# Patient Record
Sex: Female | Born: 1935 | Race: Black or African American | Hispanic: No | Marital: Married | Smoking: Former smoker
Health system: Southern US, Community
[De-identification: ages and names within clinical notes are randomized; demographics above are authoritative.]

## PROBLEM LIST (undated history)

## (undated) DIAGNOSIS — Z85038 Personal history of other malignant neoplasm of large intestine: Secondary | ICD-10-CM

## (undated) DIAGNOSIS — R011 Cardiac murmur, unspecified: Secondary | ICD-10-CM

## (undated) DIAGNOSIS — I83893 Varicose veins of bilateral lower extremities with other complications: Secondary | ICD-10-CM

## (undated) DIAGNOSIS — S61209D Unspecified open wound of unspecified finger without damage to nail, subsequent encounter: Secondary | ICD-10-CM

## (undated) DIAGNOSIS — M1990 Unspecified osteoarthritis, unspecified site: Secondary | ICD-10-CM

## (undated) DIAGNOSIS — R739 Hyperglycemia, unspecified: Secondary | ICD-10-CM

## (undated) DIAGNOSIS — I10 Essential (primary) hypertension: Secondary | ICD-10-CM

## (undated) DIAGNOSIS — E785 Hyperlipidemia, unspecified: Secondary | ICD-10-CM

## (undated) HISTORY — DX: Cardiac murmur, unspecified: R01.1

## (undated) HISTORY — DX: Unspecified osteoarthritis, unspecified site: M19.90

## (undated) HISTORY — PX: ABDOMINAL HYSTERECTOMY: SHX81

## (undated) HISTORY — DX: Hyperglycemia, unspecified: R73.9

## (undated) HISTORY — DX: Personal history of other malignant neoplasm of large intestine: Z85.038

## (undated) HISTORY — DX: Varicose veins of bilateral lower extremities with other complications: I83.893

## (undated) HISTORY — DX: Essential (primary) hypertension: I10

## (undated) HISTORY — DX: Hyperlipidemia, unspecified: E78.5

## (undated) HISTORY — DX: Unspecified open wound of unspecified finger without damage to nail, subsequent encounter: S61.209D

---

## 1997-04-25 HISTORY — PX: COLON SURGERY: SHX602

## 1999-04-26 HISTORY — PX: INGUINAL HERNIA REPAIR: SUR1180

## 2004-08-11 ENCOUNTER — Ambulatory Visit: Payer: Self-pay

## 2004-08-25 ENCOUNTER — Ambulatory Visit: Payer: Self-pay

## 2005-09-20 ENCOUNTER — Emergency Department: Payer: Self-pay | Admitting: Unknown Physician Specialty

## 2005-09-20 ENCOUNTER — Other Ambulatory Visit: Payer: Self-pay

## 2006-04-25 HISTORY — PX: VEIN SURGERY: SHX48

## 2007-01-25 ENCOUNTER — Ambulatory Visit: Payer: Self-pay

## 2008-02-19 ENCOUNTER — Ambulatory Visit: Payer: Self-pay | Admitting: Ophthalmology

## 2008-03-05 ENCOUNTER — Ambulatory Visit: Payer: Self-pay | Admitting: Ophthalmology

## 2008-04-25 HISTORY — PX: EYE SURGERY: SHX253

## 2008-06-04 ENCOUNTER — Ambulatory Visit: Payer: Self-pay | Admitting: Ophthalmology

## 2008-12-24 HISTORY — PX: COLONOSCOPY: SHX174

## 2009-01-02 ENCOUNTER — Ambulatory Visit: Payer: Self-pay | Admitting: General Surgery

## 2012-09-21 ENCOUNTER — Encounter: Payer: Self-pay | Admitting: *Deleted

## 2013-01-07 ENCOUNTER — Ambulatory Visit (INDEPENDENT_AMBULATORY_CARE_PROVIDER_SITE_OTHER): Payer: Medicare Other | Admitting: General Surgery

## 2013-01-07 ENCOUNTER — Encounter: Payer: Self-pay | Admitting: General Surgery

## 2013-01-07 VITALS — BP 120/80 | HR 78 | Resp 14 | Ht 65.0 in | Wt 162.0 lb

## 2013-01-07 DIAGNOSIS — Z85038 Personal history of other malignant neoplasm of large intestine: Secondary | ICD-10-CM

## 2013-01-07 DIAGNOSIS — I83893 Varicose veins of bilateral lower extremities with other complications: Secondary | ICD-10-CM | POA: Insufficient documentation

## 2013-01-07 NOTE — Progress Notes (Signed)
Patient ID: Erika Weiss, female   DOB: [birth year], 79 y.o.   MRN: [id number]  Chief Complaint  Patient presents with  . Follow-up    1 year follow up varicose veins    HPI Erika Weiss is a 79 y.o. female who presents for a 1 year follow up-history of colon Ca,  varicose veins. She is s/p sigmoid resection and prior RF ablation.No new problems at this time. Only c/o knee discomfort-has seen Dr. Ernest Pine for this.   HPI  Past Medical History  Diagnosis Date  . Diabetes mellitus without complication   . Hypertension   . Arthritis   . Heart murmur   . Personal history of malignant neoplasm of large intestine   . High cholesterol   . Varicose veins of lower extremities with other complications   . Cancer     colon    Past Surgical History  Procedure Laterality Date  . Abdominal hysterectomy    . Hernia repair  2001  . Vein closure  Left 2008  . Colon surgery  1999  . Colonoscopy  2008  . Eye surgery  2010    Family History  Problem Relation Age of Onset  . Cancer Father     colon    Social History History  Substance Use Topics  . Smoking status: Former Games developer  . Smokeless tobacco: Never Used  . Alcohol Use: No    No Known Allergies  Current Outpatient Prescriptions  Medication Sig Dispense Refill  . amLODipine (NORVASC) 10 MG tablet Take 10 mg by mouth daily.      Marland Kitchen CALCIUM-MAGNESIUM-ZINC PO Take 1 tablet by mouth daily.      . metoprolol succinate (TOPROL-XL) 50 MG 24 hr tablet Take 50 mg by mouth daily. Take with or immediately following a meal.      . naproxen sodium (ANAPROX) 220 MG tablet Take 220 mg by mouth as needed.      . Omega-3 Fatty Acids (FISH OIL) 1000 MG CPDR Take 1 capsule by mouth daily.      . simvastatin (ZOCOR) 10 MG tablet Take 10 mg by mouth at bedtime.      Marland Kitchen VITAMIN D, CHOLECALCIFEROL, PO Take 1 tablet by mouth daily.       No current facility-administered medications for this visit.    Review of Systems Review of Systems   Constitutional: Negative.   Respiratory: Negative.   Cardiovascular: Negative.     Blood pressure 120/80, pulse 78, resp. rate 14, height 5\' 5"  (1.651 m), weight 162 lb (73.483 kg).  Physical Exam Physical Exam  Constitutional: She is oriented to person, place, and time. She appears well-developed and well-nourished.  Eyes: Conjunctivae are normal. No scleral icterus.  Neck: No thyromegaly present.  Cardiovascular: Normal rate, regular rhythm and normal heart sounds.   No murmur heard. Pulses:      Dorsalis pedis pulses are 2+ on the right side, and 2+ on the left side.       Posterior tibial pulses are 2+ on the right side, and 2+ on the left side.  No edema. She has very few residual varicose veins. Mild skin pigmentation in lower half both legs.  Pulmonary/Chest: Effort normal and breath sounds normal.  Abdominal: Soft. Bowel sounds are normal. There is no hepatosplenomegaly. There is no tenderness. No hernia.    Lymphadenopathy:    She has no cervical adenopathy.       Right: No inguinal adenopathy present.  Left: No inguinal adenopathy present.  Neurological: She is alert and oriented to person, place, and time. She has normal strength. No sensory deficit.  Skin: Skin is warm and dry.    Data Reviewed none  Assessment    Stable exam.      Plan    42yr f/u. Encouraged pt to see Dr. Ernest Pine again regarding her knees.        Bertis Hustead G 01/07/2013, 1:00 PM

## 2013-01-07 NOTE — Patient Instructions (Addendum)
Patient to return in one year. She is to call our offices if any new problems arise.

## 2013-08-24 ENCOUNTER — Emergency Department: Payer: Self-pay | Admitting: Emergency Medicine

## 2013-08-24 LAB — CBC WITH DIFFERENTIAL/PLATELET
Basophil #: 0 10*3/uL (ref 0.0–0.1)
Basophil %: 0.4 %
Eosinophil #: 0 10*3/uL (ref 0.0–0.7)
Eosinophil %: 0.1 %
HCT: 44.8 % (ref 35.0–47.0)
HGB: 14.4 g/dL (ref 12.0–16.0)
LYMPHS PCT: 9.5 %
Lymphocyte #: 1.1 10*3/uL (ref 1.0–3.6)
MCH: 29.3 pg (ref 26.0–34.0)
MCHC: 32 g/dL (ref 32.0–36.0)
MCV: 92 fL (ref 80–100)
Monocyte #: 0.3 x10 3/mm (ref 0.2–0.9)
Monocyte %: 2.7 %
NEUTROS PCT: 87.3 %
Neutrophil #: 9.9 10*3/uL — ABNORMAL HIGH (ref 1.4–6.5)
Platelet: 248 10*3/uL (ref 150–440)
RBC: 4.89 10*6/uL (ref 3.80–5.20)
RDW: 14.1 % (ref 11.5–14.5)
WBC: 11.3 10*3/uL — ABNORMAL HIGH (ref 3.6–11.0)

## 2013-08-24 LAB — COMPREHENSIVE METABOLIC PANEL
ALT: 19 U/L (ref 12–78)
AST: 32 U/L (ref 15–37)
Albumin: 4.2 g/dL (ref 3.4–5.0)
Alkaline Phosphatase: 103 U/L
Anion Gap: 8 (ref 7–16)
BUN: 21 mg/dL — AB (ref 7–18)
Bilirubin,Total: 0.4 mg/dL (ref 0.2–1.0)
CALCIUM: 9.5 mg/dL (ref 8.5–10.1)
CHLORIDE: 106 mmol/L (ref 98–107)
CO2: 25 mmol/L (ref 21–32)
Creatinine: 0.67 mg/dL (ref 0.60–1.30)
EGFR (African American): >60
Glucose: 166 mg/dL — ABNORMAL HIGH (ref 65–99)
OSMOLALITY: 284 (ref 275–301)
Potassium: 3.7 mmol/L (ref 3.5–5.1)
SODIUM: 139 mmol/L (ref 136–145)
TOTAL PROTEIN: 8.4 g/dL — AB (ref 6.4–8.2)

## 2013-08-24 LAB — LIPASE, BLOOD: Lipase: 425 U/L — ABNORMAL HIGH (ref 73–393)

## 2013-08-24 LAB — TROPONIN I

## 2013-08-25 LAB — URINALYSIS, COMPLETE
BACTERIA: NONE SEEN
BLOOD: NEGATIVE
Bilirubin,UR: NEGATIVE
Glucose,UR: 50 mg/dL (ref 0–75)
Leukocyte Esterase: NEGATIVE
Nitrite: NEGATIVE
PH: 6 (ref 4.5–8.0)
RBC,UR: 1 /HPF (ref 0–5)
SPECIFIC GRAVITY: 1.017 (ref 1.003–1.030)
SQUAMOUS EPITHELIAL: NONE SEEN
WBC UR: 1 /HPF (ref 0–5)

## 2014-01-08 ENCOUNTER — Ambulatory Visit: Payer: Self-pay | Admitting: General Surgery

## 2014-01-29 ENCOUNTER — Encounter: Payer: Self-pay | Admitting: *Deleted

## 2014-02-24 ENCOUNTER — Encounter: Payer: Self-pay | Admitting: General Surgery

## 2014-10-09 ENCOUNTER — Ambulatory Visit: Payer: Self-pay | Admitting: Primary Care

## 2015-08-10 ENCOUNTER — Telehealth: Payer: Self-pay

## 2015-08-10 NOTE — Telephone Encounter (Signed)
pts daughter DPR signed wanted refills of meds; pt has new pt appt on 09/22/15 and advised would need to ck with previous physician for refills [name] voiced understanding.

## 2015-09-22 ENCOUNTER — Ambulatory Visit (INDEPENDENT_AMBULATORY_CARE_PROVIDER_SITE_OTHER): Payer: Medicare Other | Admitting: Family Medicine

## 2015-09-22 ENCOUNTER — Encounter: Payer: Self-pay | Admitting: Family Medicine

## 2015-09-22 VITALS — BP 128/80 | HR 60 | Temp 97.9°F | Wt 149.0 lb

## 2015-09-22 DIAGNOSIS — R739 Hyperglycemia, unspecified: Secondary | ICD-10-CM

## 2015-09-22 DIAGNOSIS — I10 Essential (primary) hypertension: Secondary | ICD-10-CM | POA: Diagnosis not present

## 2015-09-22 DIAGNOSIS — R7303 Prediabetes: Secondary | ICD-10-CM | POA: Insufficient documentation

## 2015-09-22 DIAGNOSIS — E785 Hyperlipidemia, unspecified: Secondary | ICD-10-CM

## 2015-09-22 DIAGNOSIS — F0390 Unspecified dementia without behavioral disturbance: Secondary | ICD-10-CM | POA: Insufficient documentation

## 2015-09-22 DIAGNOSIS — M1990 Unspecified osteoarthritis, unspecified site: Secondary | ICD-10-CM | POA: Insufficient documentation

## 2015-09-22 DIAGNOSIS — M150 Primary generalized (osteo)arthritis: Secondary | ICD-10-CM | POA: Diagnosis not present

## 2015-09-22 DIAGNOSIS — M159 Polyosteoarthritis, unspecified: Secondary | ICD-10-CM

## 2015-09-22 DIAGNOSIS — R011 Cardiac murmur, unspecified: Secondary | ICD-10-CM

## 2015-09-22 DIAGNOSIS — R413 Other amnesia: Secondary | ICD-10-CM

## 2015-09-22 DIAGNOSIS — Z85038 Personal history of other malignant neoplasm of large intestine: Secondary | ICD-10-CM

## 2015-09-22 MED ORDER — METOPROLOL SUCCINATE ER 50 MG PO TB24: 50.0000 mg | ORAL_TABLET | Freq: Every day | ORAL | Status: DC

## 2015-09-22 MED ORDER — MEMANTINE HCL 5 MG PO TABS: 5.0000 mg | ORAL_TABLET | Freq: Every day | ORAL | Status: DC

## 2015-09-22 MED ORDER — AMLODIPINE BESYLATE 10 MG PO TABS: 10.0000 mg | ORAL_TABLET | Freq: Every day | ORAL | Status: DC

## 2015-09-22 NOTE — Assessment & Plan Note (Signed)
namenda started 1+ yr ago - will review old records when they arrive.

## 2015-09-22 NOTE — Progress Notes (Signed)
Pre visit review using our clinic review tool, if applicable. No additional management support is needed unless otherwise documented below in the visit note. 

## 2015-09-22 NOTE — Assessment & Plan Note (Signed)
Chronic, stable. Continue current regimen. 

## 2015-09-22 NOTE — Progress Notes (Addendum)
BP 128/80 mmHg  Pulse 60  Temp(Src) 97.9 F (36.6 C) (Oral)  Wt 149 lb (67.586 kg)  SpO2 96%   CC: new pt to establish care  Subjective:    Patient ID: Erika Weiss, female    DOB: July 12, 1924, 82 y.o.   MRN: [id number]  HPI: Erika Weiss is a 82 y.o. female presenting on 09/22/2015 for Establish Care   Presents with 2 daughters Cora Collum). Prior saw Dr Loma Sender. Had not been seen in over a year.  HTN - Compliant with current antihypertensive regimen of amlodipine  daily and metoprol XR  daily. Does not check blood pressures at home.  No low blood pressure readings or symptoms of dizziness/syncope.  Denies HA, vision changes, CP/tightness, SOB, leg swelling.    Osteoarthritis - mostly knees  HLD - was on simvastatin  daily (ran out last month) and intermittent fish oil.   DM - pt denies, told she was borderline. Does not check at home.   Memory loss - on namenda 5 mg daily. This was started 04/2014.   Lives with husband Occ: retired, worked in Education officer, environmental at [employer] Activity: some walking Diet: good water, fruits/vegetables daily  Relevant past medical, surgical, family and social history reviewed and updated as indicated. Interim medical history since our last visit reviewed. Allergies and medications reviewed and updated. Current Outpatient Prescriptions on File Prior to Visit  Medication Sig  . naproxen sodium (ANAPROX) 220 MG tablet Take 220 mg by mouth as needed.  . Omega-3 Fatty Acids (FISH OIL) 1000 MG CPDR Take 1 capsule by mouth daily.   No current facility-administered medications on file prior to visit.    Review of Systems Per HPI unless specifically indicated in ROS section     Objective:    BP 128/80 mmHg  Pulse 60  Temp(Src) 97.9 F (36.6 C) (Oral)  Wt 149 lb (67.586 kg)  SpO2 96%  Wt Readings from Last 3 Encounters:  09/22/15 149 lb (67.586 kg)  01/07/13 162 lb (73.483 kg)    Physical Exam    Constitutional: She appears well-developed and well-nourished. No distress.  HENT:  Mouth/Throat: Oropharynx is clear and moist. No oropharyngeal exudate.  Eyes: Conjunctivae and EOM are normal. Pupils are equal, round, and reactive to light. No scleral icterus.  Neck: Normal range of motion. Neck supple.  Cardiovascular: Normal rate, regular rhythm and intact distal pulses.  Friction rub: 4/6 SEM best at USB.   Murmur heard. Pulmonary/Chest: Effort normal and breath sounds normal. No respiratory distress. She has no wheezes. She has no rales.  Musculoskeletal: She exhibits no edema.  Skin: Skin is warm and dry. No rash noted.  Psychiatric: She has a normal mood and affect.  Nursing note and vitals reviewed.      Assessment & Plan:   Problem List Items Addressed This Visit    History of colon cancer    Will request colonoscopy records from [facility]      Hypertension - Primary    Chronic, stable. Continue current regimen.      Relevant Medications   amLODipine (NORVASC) 10 MG tablet   metoprolol succinate (TOPROL-XL) 50 MG 24 hr tablet   HLD (hyperlipidemia)    Off simvastatin for last 1 month. No known fmhx CAD - will check FLP next fasting labs in 3-4 mo and then determine need for statin.      Relevant Medications   amLODipine (NORVASC) 10 MG tablet   metoprolol succinate (TOPROL-XL) 50  MG 24 hr tablet   Hyperglycemia    Check A1c/cbg next labwork      Osteoarthritis    Continue tylenol/aleve PRN. Discussed possible supplementation for osteoarthritis (vit D and glucosamine)      Memory loss    namenda started 1+ yr ago - will review old records when they arrive.      Systolic murmur    Check records - anticipate AS related murmur - check prior arecords          Follow up plan: Return in about 3 months (around 12/23/2015), or as needed, for follow up visit, medicare wellness visit.  Eustaquio BoydenJavier Adri Schloss, MD

## 2015-09-22 NOTE — Assessment & Plan Note (Signed)
[facility]

## 2015-09-22 NOTE — Patient Instructions (Addendum)
You are doing well today. I've refilled medicines to mail order pharmacy.  Return in 3 months for medicare wellness visit, sooner if needed We will request records from Dr Vear ClockPhillips and latest colonoscopy [facility]

## 2015-09-22 NOTE — Assessment & Plan Note (Addendum)
Check records - anticipate AS related murmur - check prior arecords

## 2015-09-22 NOTE — Assessment & Plan Note (Signed)
Continue tylenol/aleve PRN. Discussed possible supplementation for osteoarthritis (vit D and glucosamine)

## 2015-09-22 NOTE — Assessment & Plan Note (Signed)
Off simvastatin for last 1 month. No known fmhx CAD - will check FLP next fasting labs in 3-4 mo and then determine need for statin.

## 2015-09-22 NOTE — Assessment & Plan Note (Signed)
Check A1c/cbg next labwork

## 2015-09-26 ENCOUNTER — Encounter: Payer: Self-pay | Admitting: Family Medicine

## 2015-10-02 ENCOUNTER — Other Ambulatory Visit: Payer: Self-pay

## 2015-10-02 MED ORDER — MEMANTINE HCL 5 MG PO TABS: 5.0000 mg | ORAL_TABLET | Freq: Every day | ORAL | Status: DC

## 2015-10-02 NOTE — Telephone Encounter (Signed)
Shirlee LimerickMarion [facility] and problem with pt getting [facility]. [facility] but Shirlee LimerickMarion said pt does not have med and [phone]. Advised done.

## 2015-10-16 ENCOUNTER — Ambulatory Visit: Payer: Medicare Other | Admitting: Family Medicine

## 2015-10-16 ENCOUNTER — Telehealth: Payer: Self-pay

## 2015-10-16 NOTE — Telephone Encounter (Signed)
Erika Weiss pts daughter said pts dementia is worsening; pt and husband anger issues are worsening; scheduled appt on 10/16/15 but changed to 10/21/15 because daughter said difficult to get pt to office to be seen and thinks would be better for pt to see Dr Reece AgarG who is familiar with pt. Erika Weiss will cb prior to appt if needed.Fyi to Dr Reece AgarG.

## 2015-10-21 ENCOUNTER — Ambulatory Visit (INDEPENDENT_AMBULATORY_CARE_PROVIDER_SITE_OTHER): Payer: Medicare Other | Admitting: Family Medicine

## 2015-10-21 ENCOUNTER — Encounter: Payer: Self-pay | Admitting: Family Medicine

## 2015-10-21 VITALS — BP 144/82 | HR 67 | Temp 98.2°F | Wt 148.5 lb

## 2015-10-21 DIAGNOSIS — R413 Other amnesia: Secondary | ICD-10-CM | POA: Diagnosis not present

## 2015-10-21 DIAGNOSIS — R454 Irritability and anger: Secondary | ICD-10-CM

## 2015-10-21 MED ORDER — SERTRALINE HCL 25 MG PO TABS: 25.0000 mg | ORAL_TABLET | Freq: Every day | ORAL | Status: DC

## 2015-10-21 NOTE — Progress Notes (Signed)
Pre visit review using our clinic review tool, if applicable. No additional management support is needed unless otherwise documented below in the visit note. 

## 2015-10-21 NOTE — Patient Instructions (Addendum)
I recommend starting attending church.  Don't take laxative daily. Start colace stool softener daily.  I suggest starting sertraline 25mg  once daily.  See you in 2 months. Let us know if questions.

## 2015-10-21 NOTE — Assessment & Plan Note (Signed)
No frank worsening noted. Continue namenda.

## 2015-10-21 NOTE — Progress Notes (Signed)
   BP 144/82 mmHg  Pulse 67  Temp(Src) 98.2 F (36.8 C) (Oral)  Wt 148 lb 8 oz (67.359 kg)  SpO2 97%   CC: worsening irritability  Subjective:    Patient ID: Erika Weiss, female    DOB: 08/01/1924, 82 y.o.   MRN: [id number]  HPI: Erika Weiss is a 82 y.o. female presenting on 10/21/2015 for Dementia   Presents with daughters and spouse. Pt established care last month. I see spouse as well who also has memory issues. Namenda 5mg  daily started 04/2014.   I still have not received prior records to review.   Family endorse some sadness with thinking about parents. No trouble sleeping, good appetite, concentration ok, energy ok. Pt has stopped going to church - she previously did enjoy this tremendously.   Situation has never come to blows.   Family endorses she occasionally misses daily meds.  No acute memory changes however.  Couple doesn't drive.  Relevant past medical, surgical, family and social history reviewed and updated as indicated. Interim medical history since our last visit reviewed. Allergies and medications reviewed and updated. Current Outpatient Prescriptions on File Prior to Visit  Medication Sig  . amLODipine (NORVASC) 10 MG tablet Take 1 tablet (10 mg total) by mouth daily.  . memantine (NAMENDA) 5 MG tablet Take 1 tablet (5 mg total) by mouth daily.  . metoprolol succinate (TOPROL-XL) 50 MG 24 hr tablet Take 1 tablet (50 mg total) by mouth daily. Take with or immediately following a meal.  . naproxen sodium (ANAPROX) 220 MG tablet Take 220 mg by mouth as needed.  . Omega-3 Fatty Acids (FISH OIL) 1000 MG CPDR Take 1 capsule by mouth daily.   No current facility-administered medications on file prior to visit.    Review of Systems Per HPI unless specifically indicated in ROS section     Objective:    BP 144/82 mmHg  Pulse 67  Temp(Src) 98.2 F (36.8 C) (Oral)  Wt 148 lb 8 oz (67.359 kg)  SpO2 97%  Wt Readings from Last 3 Encounters:    10/21/15 148 lb 8 oz (67.359 kg)  09/22/15 149 lb (67.586 kg)  01/07/13 162 lb (73.483 kg)    Physical Exam  Constitutional: She appears well-developed and well-nourished. No distress.  Psychiatric: She has a normal mood and affect. Her behavior is normal. Thought content normal.  Nursing note and vitals reviewed.     Assessment & Plan:   Problem List Items Addressed This Visit    Memory loss    No frank worsening noted. Continue namenda.       Irritability and anger - Primary    Family concerns clarified to mean increasing irritabilitiy, anger outbursts towards spouse. It seems patient has been thinking more about her parents and this has caused depressed mood with anhedonia. Daughters not concerned with patient's ability to stay in current living situation at home alone with spouse.  I did encourage she return to church regularly.  No frank worsening of dementia noted. Pt interested in trial antidepressant - will start sertraline 25mg  daily, reassess in 2 mo f/u.          Follow up plan: No Follow-up on file.  Eustaquio BoydenJavier Laniah Grimm, MD

## 2015-10-21 NOTE — Assessment & Plan Note (Addendum)
Family concerns clarified to mean increasing irritabilitiy, anger outbursts towards spouse. It seems patient has been thinking more about her parents and this has caused depressed mood with anhedonia. Daughters not concerned with patient's ability to stay in current living situation at home alone with spouse.  I did encourage she return to church regularly.  No frank worsening of dementia noted. Pt interested in trial antidepressant - will start sertraline 25mg  daily, reassess in 2 mo f/u.

## 2015-11-03 ENCOUNTER — Other Ambulatory Visit: Payer: Self-pay | Admitting: Family Medicine

## 2015-11-03 NOTE — Telephone Encounter (Signed)
Request status of memantine refill;advised already refilled; pt family will ck with pharmacy.

## 2015-11-06 ENCOUNTER — Other Ambulatory Visit: Payer: Self-pay | Admitting: *Deleted

## 2015-11-06 MED ORDER — METOPROLOL SUCCINATE ER 50 MG PO TB24: 50.0000 mg | ORAL_TABLET | Freq: Every day | ORAL | Status: DC

## 2015-11-10 ENCOUNTER — Other Ambulatory Visit: Payer: Self-pay | Admitting: *Deleted

## 2015-12-16 ENCOUNTER — Ambulatory Visit (INDEPENDENT_AMBULATORY_CARE_PROVIDER_SITE_OTHER): Payer: Medicare Other

## 2015-12-16 ENCOUNTER — Other Ambulatory Visit: Payer: Self-pay | Admitting: Family Medicine

## 2015-12-16 ENCOUNTER — Other Ambulatory Visit (INDEPENDENT_AMBULATORY_CARE_PROVIDER_SITE_OTHER): Payer: Medicare Other

## 2015-12-16 VITALS — BP 150/80 | HR 70 | Temp 97.8°F | Ht 63.0 in | Wt 142.5 lb

## 2015-12-16 DIAGNOSIS — E785 Hyperlipidemia, unspecified: Secondary | ICD-10-CM

## 2015-12-16 DIAGNOSIS — R413 Other amnesia: Secondary | ICD-10-CM | POA: Diagnosis not present

## 2015-12-16 DIAGNOSIS — Z0000 Encounter for general adult medical examination without abnormal findings: Secondary | ICD-10-CM | POA: Diagnosis not present

## 2015-12-16 DIAGNOSIS — I10 Essential (primary) hypertension: Secondary | ICD-10-CM | POA: Diagnosis not present

## 2015-12-16 LAB — VITAMIN B12: Vitamin B-12: 399 pg/mL (ref 211–911)

## 2015-12-16 LAB — LIPID PANEL
CHOL/HDL RATIO: 3
Cholesterol: 236 mg/dL — ABNORMAL HIGH (ref 0–200)
HDL: 70.1 mg/dL (ref >39.00)
LDL CALC: 146 mg/dL — AB (ref 0–99)
NonHDL: 166.24
Triglycerides: 100 mg/dL (ref 0.0–149.0)
VLDL: 20 mg/dL (ref 0.0–40.0)

## 2015-12-16 LAB — COMPREHENSIVE METABOLIC PANEL
ALBUMIN: 4.5 g/dL (ref 3.5–5.2)
ALT: 11 U/L (ref 0–35)
AST: 19 U/L (ref 0–37)
Alkaline Phosphatase: 82 U/L (ref 39–117)
BILIRUBIN TOTAL: 0.5 mg/dL (ref 0.2–1.2)
BUN: 27 mg/dL — ABNORMAL HIGH (ref 6–23)
CALCIUM: 9.9 mg/dL (ref 8.4–10.5)
CO2: 26 mEq/L (ref 19–32)
CREATININE: 1.13 mg/dL (ref 0.40–1.20)
Chloride: 104 mEq/L (ref 96–112)
GFR: 58.3 mL/min — ABNORMAL LOW (ref >60.00)
Glucose, Bld: 120 mg/dL — ABNORMAL HIGH (ref 70–99)
Potassium: 4.1 mEq/L (ref 3.5–5.1)
Sodium: 139 mEq/L (ref 135–145)
Total Protein: 7.8 g/dL (ref 6.0–8.3)

## 2015-12-16 LAB — CBC WITH DIFFERENTIAL/PLATELET
BASOS ABS: 0 10*3/uL (ref 0.0–0.1)
BASOS PCT: 0.3 % (ref 0.0–3.0)
EOS ABS: 0.1 10*3/uL (ref 0.0–0.7)
Eosinophils Relative: 1 % (ref 0.0–5.0)
HCT: 42.5 % (ref 36.0–46.0)
Hemoglobin: 14.4 g/dL (ref 12.0–15.0)
Lymphocytes Relative: 26.4 % (ref 12.0–46.0)
Lymphs Abs: 2.2 10*3/uL (ref 0.7–4.0)
MCHC: 33.9 g/dL (ref 30.0–36.0)
MCV: 88.8 fl (ref 78.0–100.0)
Monocytes Absolute: 0.7 10*3/uL (ref 0.1–1.0)
Monocytes Relative: 8.3 % (ref 3.0–12.0)
NEUTROS PCT: 64 % (ref 43.0–77.0)
Neutro Abs: 5.2 10*3/uL (ref 1.4–7.7)
PLATELETS: 258 10*3/uL (ref 150.0–400.0)
RBC: 4.78 Mil/uL (ref 3.87–5.11)
RDW: 14.3 % (ref 11.5–15.5)
WBC: 8.2 10*3/uL (ref 4.0–10.5)

## 2015-12-16 LAB — TSH: TSH: 1.46 u[IU]/mL (ref 0.35–4.50)

## 2015-12-16 NOTE — Progress Notes (Signed)
PCP notes:   Health maintenance:   Flu vaccine - addressed Tetanus - addressed Bone density - addressed Shingles - addressed PNA vaccines - addressed  Abnormal screenings:   Depression/PHQ2 score: 1  Patient concerns:   None  Nurse concerns:  Discussed pt's unintentional weight loss over past 2 months. Weight decreased from 149lbs to 142lb 8oz. Encouraged pt to make healthy food choices and to not skip meals. Per pt's daughter, pt and her spouse "eat out alot".    Next PCP appt:   12/23/2015 @ 1130

## 2015-12-16 NOTE — Progress Notes (Signed)
Pre visit review using our clinic review tool, if applicable. No additional management support is needed unless otherwise documented below in the visit note. 

## 2015-12-16 NOTE — Progress Notes (Signed)
Subjective:   Erika Weiss is a 82 y.o. female who presents for an Initial Medicare Annual Wellness Visit.  Review of Systems    N/A  Cardiac Risk Factors include: advanced age (>7355men, 73>65 women);hypertension;dyslipidemia;sedentary lifestyle     Objective:    Today's Vitals   12/16/15 0844  BP: (!) 150/80  Pulse: 70  Temp: 97.8 F (36.6 C)  TempSrc: Oral  SpO2: 96%  Weight: 142 lb 8 oz (64.6 kg)  Height: 5\' 3"  (1.6 m)  PainSc: 0-No pain   Body mass index is 25.24 kg/m.   Current Medications (verified) Outpatient Encounter Prescriptions as of 12/16/2015  Medication Sig  . amLODipine (NORVASC) 10 MG tablet Take 1 tablet (10 mg total) by mouth daily.  Marland Kitchen. docusate sodium (COLACE) 100 MG capsule Take 100 mg by mouth daily as needed for mild constipation.  . memantine (NAMENDA) 5 MG tablet TAKE 1 TABLET BY MOUTH DAILY  . metoprolol succinate (TOPROL-XL) 50 MG 24 hr tablet Take 1 tablet (50 mg total) by mouth daily. Take with or immediately following a meal.  . naproxen sodium (ANAPROX) 220 MG tablet Take 220 mg by mouth as needed.  . sertraline (ZOLOFT) 25 MG tablet Take 1 tablet (25 mg total) by mouth daily.  . [DISCONTINUED] Omega-3 Fatty Acids (FISH OIL) 1000 MG CPDR Take 1 capsule by mouth daily.   No facility-administered encounter medications on file as of 12/16/2015.     Allergies (verified) Review of patient's allergies indicates no known allergies.   History: Past Medical History:  Diagnosis Date  . Heart murmur new 2017?  Marland Kitchen. History of colon cancer   . HLD (hyperlipidemia)   . Hyperglycemia   . Hypertension   . Osteoarthritis    knees  . Varicose veins of lower extremities with other complications    Past Surgical History:  Procedure Laterality Date  . ABDOMINAL HYSTERECTOMY  remote   fibroids  . COLON SURGERY  1999   colon cancer  . COLONOSCOPY  12/2008   TAx1 Evette Cristal(Sankar)  . EYE SURGERY  2010  . INGUINAL HERNIA REPAIR  2001  . VEIN SURGERY Left  2008   Family History  Problem Relation Age of Onset  . Cancer Father     colon  . Cancer Sister     breast  . Cancer Brother     pancreas  . Cancer Brother     colon  . Diabetes Sister   . Diabetes Other     nephews  . Heart disease Sister   . Stroke Mother   . Hypertension Mother   . Hypertension Other     multiple  . CAD Neg Hx    Social History   Occupational History  . Not on file.   Social History Main Topics  . Smoking status: Former Smoker    Quit date: 04/25/1978  . Smokeless tobacco: Never Used  . Alcohol use No  . Drug use: No  . Sexual activity: No    Tobacco Counseling Counseling given: No   Activities of Daily Living In your present state of health, do you have any difficulty performing the following activities: 12/16/2015  Hearing? N  Vision? N  Difficulty concentrating or making decisions? Y  Walking or climbing stairs? N  Dressing or bathing? N  Doing errands, shopping? Y  Preparing Food and eating ? N  Using the Toilet? N  In the past six months, have you accidently leaked urine? N  Do you have  problems with loss of bowel control? N  Managing your Medications? N  Managing your Finances? Y  Housekeeping or managing your Housekeeping? Y  Some recent data might be hidden    Immunizations and Health Maintenance Immunization History  Administered Date(s) Administered  . Influenza, Seasonal, Injecte, Preservative Fre 04/26/2015   There are no preventive care reminders to display for this patient.  Patient Care Team: Eustaquio BoydenJavier Gutierrez, MD as PCP - General (Family Medicine) Seeplaputhur Wynona LunaG Sankar, MD (General Surgery)     Assessment:   This is a routine wellness examination for Erika.   Hearing/Vision screen  Hearing Screening   125Hz  250Hz  500Hz  1000Hz  2000Hz  3000Hz  4000Hz  6000Hz  8000Hz   Right ear:   40 40 40  40    Left ear:   40 40 40  40      Visual Acuity Screening   Right eye Left eye Both eyes  Without correction:       With correction: 20/40 20/40 20/40     Dietary issues and exercise activities discussed: Current Exercise Habits: The patient does not participate in regular exercise at present, Exercise limited by: None identified  Goals    . Increase water intake          Starting 12/16/2015, I will attempt to drink at least 6-8 glasses of water daily.       Depression Screen PHQ 2/9 Scores 12/16/2015  PHQ - 2 Score 1    Fall Risk Fall Risk  12/16/2015  Falls in the past year? No    Cognitive Function: MMSE - Mini Mental State Exam 12/16/2015  Not completed: (No Data)  Note: Dx of memory loss; taking Namenda  Screening Tests Health Maintenance  Topic Date Due  . INFLUENZA VACCINE  07/23/2016 (Originally 11/24/2015)  . DTaP/Tdap/Td (1 - Tdap) 12/15/2016 (Originally 11/21/1943)  . DEXA SCAN  12/15/2016 (Originally 11/22/1991)  . ZOSTAVAX  12/15/2016 (Originally 11/22/1986)  . TETANUS/TDAP  12/15/2016 (Originally [birth year]21/1943)  . PNA vac Low Risk Adult (1 of 2 - PCV13) 12/15/2016 (Originally 11/22/1991)      Plan:     I have personally reviewed and addressed the Medicare Annual Wellness questionnaire and have noted the following in the patient's chart:  A. Medical and social history B. Use of alcohol, tobacco or illicit drugs  C. Current medications and supplements D. Functional ability and status E.  Nutritional status F.  Physical activity G. Advance directives H. List of other physicians I.  Hospitalizations, surgeries, and ER visits in previous 12 months J.  Vitals K. Screenings to include hearing, vision, cognitive, depression L. Referrals and appointments - none  In addition, I have reviewed and discussed with patient certain preventive protocols, quality metrics, and best practice recommendations. A written personalized care plan for preventive services as well as general preventive health recommendations were provided to patient.  See attached scanned questionnaire for additional  information.   Signed,   Randa EvensLesia Pinson, MHA, BS, LPN Health Advisor

## 2015-12-16 NOTE — Patient Instructions (Signed)
[name] , Thank you for taking time to come for your Medicare Wellness Visit. I appreciate your ongoing commitment to your health goals. Please review the following plan we discussed and let me know if I can assist you in the future.   These are the goals we discussed: Goals    . Increase water intake          Starting 12/16/2015, I will attempt to drink at least 6-8 glasses of water daily.        This is a list of the screening recommended for you and due dates:  Health Maintenance  Topic Date Due  . Flu Shot  07/23/2016*  . DTaP/Tdap/Td vaccine (1 - Tdap) 12/15/2016*  . DEXA scan (bone density measurement)  12/15/2016*  . Shingles Vaccine  12/15/2016*  . Tetanus Vaccine  12/15/2016*  . Pneumonia vaccines (1 of 2 - PCV13) 12/15/2016*  *Topic was postponed. The date shown is not the original due date.   Preventive Care for Adults  A healthy lifestyle and preventive care can promote health and wellness. Preventive health guidelines for adults include the following key practices.  . A routine yearly physical is a good way to check with your health care provider about your health and preventive screening. It is a chance to share any concerns and updates on your health and to receive a thorough exam.  . Visit your dentist for a routine exam and preventive care every 6 months. Brush your teeth twice a day and floss once a day. Good oral hygiene prevents tooth decay and gum disease.  . The frequency of eye exams is based on your age, health, family medical history, use  of contact lenses, and other factors. Follow your health care provider's ecommendations for frequency of eye exams.  . Eat a healthy diet. Foods like vegetables, fruits, whole grains, low-fat dairy products, and lean protein foods contain the nutrients you need without too many calories. Decrease your intake of foods high in solid fats, added sugars, and salt. Eat the right amount of calories for you. Get information  about a proper diet from your health care provider, if necessary.  . Regular physical exercise is one of the most important things you can do for your health. Most adults should get at least 150 minutes of moderate-intensity exercise (any activity that increases your heart rate and causes you to sweat) each week. In addition, most adults need muscle-strengthening exercises on 2 or more days a week.  Silver Sneakers may be a benefit available to you. To determine eligibility, you may visit the website: www.silversneakers.com or contact program at [phone] Mon-Fri between 8AM-8PM.   . Maintain a healthy weight. The body mass index (BMI) is a screening tool to identify possible weight problems. It provides an estimate of body fat based on height and weight. Your health care provider can find your BMI and can help you achieve or maintain a healthy weight.   For adults 20 years and older: ? A BMI below 18.5 is considered underweight. ? A BMI of 18.5 to 24.9 is normal. ? A BMI of 25 to 29.9 is considered overweight. ? A BMI of 30 and above is considered obese.   . Maintain normal blood lipids and cholesterol levels by exercising and minimizing your intake of saturated fat. Eat a balanced diet with plenty of fruit and vegetables. Blood tests for lipids and cholesterol should begin at age 80 and be repeated every 5 years. If your lipid or  cholesterol levels are high, you are over 50, or you are at high risk for heart disease, you may need your cholesterol levels checked more frequently. Ongoing high lipid and cholesterol levels should be treated with medicines if diet and exercise are not working.  . If you smoke, find out from your health care provider how to quit. If you do not use tobacco, please do not start.  . If you choose to drink alcohol, please do not consume more than 2 drinks per day. One drink is considered to be 12 ounces (355 mL) of beer, 5 ounces (148 mL) of wine, or 1.5 ounces (44  mL) of liquor.  . If you are 24-62 years old, ask your health care provider if you should take aspirin to prevent strokes.  . Use sunscreen. Apply sunscreen liberally and repeatedly throughout the day. You should seek shade when your shadow is shorter than you. Protect yourself by wearing long sleeves, pants, a wide-brimmed hat, and sunglasses year round, whenever you are outdoors.  . Once a month, do a whole body skin exam, using a mirror to look at the skin on your back. Tell your health care provider of new moles, moles that have irregular borders, moles that are larger than a pencil eraser, or moles that have changed in shape or color.

## 2015-12-18 NOTE — Progress Notes (Signed)
I reviewed health advisor's note, was available for consultation, and agree with documentation and plan.  

## 2015-12-23 ENCOUNTER — Encounter: Payer: Self-pay | Admitting: Family Medicine

## 2015-12-23 ENCOUNTER — Ambulatory Visit (INDEPENDENT_AMBULATORY_CARE_PROVIDER_SITE_OTHER): Payer: Medicare Other | Admitting: Family Medicine

## 2015-12-23 VITALS — BP 146/76 | HR 56 | Temp 97.6°F | Wt 144.2 lb

## 2015-12-23 DIAGNOSIS — R739 Hyperglycemia, unspecified: Secondary | ICD-10-CM

## 2015-12-23 DIAGNOSIS — R454 Irritability and anger: Secondary | ICD-10-CM

## 2015-12-23 DIAGNOSIS — R413 Other amnesia: Secondary | ICD-10-CM

## 2015-12-23 DIAGNOSIS — Z23 Encounter for immunization: Secondary | ICD-10-CM | POA: Diagnosis not present

## 2015-12-23 DIAGNOSIS — I10 Essential (primary) hypertension: Secondary | ICD-10-CM

## 2015-12-23 DIAGNOSIS — E785 Hyperlipidemia, unspecified: Secondary | ICD-10-CM | POA: Diagnosis not present

## 2015-12-23 DIAGNOSIS — R011 Cardiac murmur, unspecified: Secondary | ICD-10-CM

## 2015-12-23 MED ORDER — MEMANTINE HCL 10 MG PO TABS
10.0000 mg | ORAL_TABLET | Freq: Every day | ORAL | 11 refills | Status: DC
Start: 2015-12-23 — End: 2016-03-24

## 2015-12-23 NOTE — Assessment & Plan Note (Signed)
Overall improved on sertraline 25mg  daily. Encouraged restart attending church.

## 2015-12-23 NOTE — Assessment & Plan Note (Signed)
Stable

## 2015-12-23 NOTE — Progress Notes (Signed)
Pre visit review using our clinic review tool, if applicable. No additional management support is needed unless otherwise documented below in the visit note. 

## 2015-12-23 NOTE — Assessment & Plan Note (Signed)
Chronic. Remains off statin. High HDL. Discussed low cholesterol diet. Ok to stay off statin.

## 2015-12-23 NOTE — Progress Notes (Signed)
BP (!) 146/76   Pulse (!) 56   Temp 97.6 F (36.4 C) (Oral)   Wt 144 lb 4 oz (65.4 kg)   BMI 25.55 kg/m    CC: 3 mo f/u visit Subjective:    Patient ID: Erika Weiss, female    DOB: 1924-12-13, 82 y.o.   MRN: [id number]  HPI: Erika R Szeliga is a 82 y.o. female presenting on 12/23/2015 for Annual Exam   See prior note for details. Saw Lesia last week for medicare wellness visit, note reviewed. Noted 7lb weight loss. Then gained 2 lbs in last week. Reviewed dietary recommendations made by health coach.  Regular soft stool daily. Continues colace with milk of magnesium.  Appetite ok, eats 3 meals daily.   Namenda 5mg  daily started 04/2014, sertraline 25mg  daily started 09/2015 for worsening irritability.   Still has not returned to church.   Relevant past medical, surgical, family and social history reviewed and updated as indicated. Interim medical history since our last visit reviewed. Allergies and medications reviewed and updated. Current Outpatient Prescriptions on File Prior to Visit  Medication Sig  . amLODipine (NORVASC) 10 MG tablet Take 1 tablet (10 mg total) by mouth daily.  Marland Kitchen docusate sodium (COLACE) 100 MG capsule Take 100 mg by mouth daily as needed for mild constipation.  . metoprolol succinate (TOPROL-XL) 50 MG 24 hr tablet Take 1 tablet (50 mg total) by mouth daily. Take with or immediately following a meal.  . naproxen sodium (ANAPROX) 220 MG tablet Take 220 mg by mouth as needed.  . sertraline (ZOLOFT) 25 MG tablet Take 1 tablet (25 mg total) by mouth daily.   No current facility-administered medications on file prior to visit.     Review of Systems Per HPI unless specifically indicated in ROS section     Objective:    BP (!) 146/76   Pulse (!) 56   Temp 97.6 F (36.4 C) (Oral)   Wt 144 lb 4 oz (65.4 kg)   BMI 25.55 kg/m   Wt Readings from Last 3 Encounters:  12/23/15 144 lb 4 oz (65.4 kg)  12/16/15 142 lb 8 oz (64.6 kg)  10/21/15 148 lb 8  oz (67.4 kg)    Physical Exam  Constitutional: She appears well-developed and well-nourished. No distress.  HENT:  Head: Normocephalic and atraumatic.  Mouth/Throat: Oropharynx is clear and moist. No oropharyngeal exudate.  Cardiovascular: Normal rate, regular rhythm and intact distal pulses.   Murmur (3/6 SEM) heard. Pulmonary/Chest: Effort normal and breath sounds normal. No respiratory distress. She has no wheezes. She has no rales.  Musculoskeletal: She exhibits no edema.  Skin: Skin is warm and dry. No rash noted.  Psychiatric: She has a normal mood and affect.  Nursing note and vitals reviewed.  Results for orders placed or performed in visit on 12/16/15  Lipid panel  Result Value Ref Range   Cholesterol 236 (H) 0 - 200 mg/dL   Triglycerides 696.2 0.0 - 149.0 mg/dL   HDL 95.28 >41.32 mg/dL   VLDL 44.0 0.0 - 10.2 mg/dL   LDL Cholesterol 725 (H) 0 - 99 mg/dL   Total CHOL/HDL Ratio 3    NonHDL 166.24   Comprehensive metabolic panel  Result Value Ref Range   Sodium 139 135 - 145 mEq/L   Potassium 4.1 3.5 - 5.1 mEq/L   Chloride 104 96 - 112 mEq/L   CO2 26 19 - 32 mEq/L   Glucose, Bld 120 (H) 70 - 99 mg/dL  BUN 27 (H) 6 - 23 mg/dL   Creatinine, Ser 8.291.13 0.40 - 1.20 mg/dL   Total Bilirubin 0.5 0.2 - 1.2 mg/dL   Alkaline Phosphatase 82 39 - 117 U/L   AST 19 0 - 37 U/L   ALT 11 0 - 35 U/L   Total Protein 7.8 6.0 - 8.3 g/dL   Albumin 4.5 3.5 - 5.2 g/dL   Calcium 9.9 8.4 - 56.210.5 mg/dL   GFR 13.0858.30 (L) >65.78>60.00 mL/min  TSH  Result Value Ref Range   TSH 1.46 0.35 - 4.50 uIU/mL  CBC with Differential/Platelet  Result Value Ref Range   WBC 8.2 4.0 - 10.5 K/uL   RBC 4.78 3.87 - 5.11 Mil/uL   Hemoglobin 14.4 12.0 - 15.0 g/dL   HCT 46.942.5 62.936.0 - 52.846.0 %   MCV 88.8 78.0 - 100.0 fl   MCHC 33.9 30.0 - 36.0 g/dL   RDW 41.314.3 24.411.5 - 01.015.5 %   Platelets 258.0 150.0 - 400.0 K/uL   Neutrophils Relative % 64.0 43.0 - 77.0 %   Lymphocytes Relative 26.4 12.0 - 46.0 %   Monocytes Relative 8.3  3.0 - 12.0 %   Eosinophils Relative 1.0 0.0 - 5.0 %   Basophils Relative 0.3 0.0 - 3.0 %   Neutro Abs 5.2 1.4 - 7.7 K/uL   Lymphs Abs 2.2 0.7 - 4.0 K/uL   Monocytes Absolute 0.7 0.1 - 1.0 K/uL   Eosinophils Absolute 0.1 0.0 - 0.7 K/uL   Basophils Absolute 0.0 0.0 - 0.1 K/uL  Vitamin B12  Result Value Ref Range   Vitamin B-12 399 211 - 911 pg/mL      Assessment & Plan:   Problem List Items Addressed This Visit    HLD (hyperlipidemia)    Chronic. Remains off statin. High HDL. Discussed low cholesterol diet. Ok to stay off statin.       Hyperglycemia    Discussed avoiding sweetened beverages.      Hypertension    Chronic, adequate. Continue current regimen.       Irritability and anger    Overall improved on sertraline 25mg  daily. Encouraged restart attending church.      Memory loss - Primary    Daughter desires increased namenda - will send 10mg  daily dose      Systolic murmur    Stable.       Other Visit Diagnoses    Need for influenza vaccination       Relevant Orders   Flu Vaccine QUAD 36+ mos PF IM (Fluarix & Fluzone Quad PF) (Completed)       Follow up plan: Return in about 6 months (around 06/22/2016) for follow up visit.  Eustaquio BoydenJavier Sarika Baldini, MD

## 2015-12-23 NOTE — Assessment & Plan Note (Signed)
Chronic, adequate. Continue current regimen.  

## 2015-12-23 NOTE — Assessment & Plan Note (Addendum)
Daughter desires increased namenda - will send 10mg  daily dose

## 2015-12-23 NOTE — Assessment & Plan Note (Signed)
Discussed avoiding sweetened beverages.

## 2015-12-23 NOTE — Patient Instructions (Addendum)
Flu shot today. Let's watch memory and if you notice worsening, let me know to increase namenda dose. For now, continue current medicines.  Cholesterol levels overall ok - no need for medicine. Work on low cholesterol diet - handout provided today. Consider B12 supplement daily over the counter. We will watch kidney function as it was a bit impaired - make sure to stay well hydrated with plenty of water. Avoid ibuprofen, aleve, advil. Tylenol is ok to use if needed.  Watch added sugars in the diet.

## 2016-01-21 ENCOUNTER — Other Ambulatory Visit: Payer: Self-pay | Admitting: Family Medicine

## 2016-03-23 ENCOUNTER — Other Ambulatory Visit: Payer: Self-pay | Admitting: Family Medicine

## 2016-03-24 ENCOUNTER — Other Ambulatory Visit: Payer: Self-pay | Admitting: *Deleted

## 2016-03-24 MED ORDER — MEMANTINE HCL 10 MG PO TABS: 10.0000 mg | ORAL_TABLET | Freq: Every day | ORAL | 1 refills | Status: DC

## 2016-03-24 MED ORDER — SERTRALINE HCL 25 MG PO TABS: 25.0000 mg | ORAL_TABLET | Freq: Every day | ORAL | 1 refills | Status: DC

## 2016-05-04 ENCOUNTER — Other Ambulatory Visit: Payer: Self-pay

## 2016-05-04 MED ORDER — METOPROLOL SUCCINATE ER 50 MG PO TB24: 50.0000 mg | ORAL_TABLET | Freq: Every day | ORAL | 2 refills | Status: DC

## 2016-05-04 NOTE — Telephone Encounter (Signed)
Erika Weiss(DPR signed) [facility]; refill done per protocol; pt seen 12/23/15.Armando ReichertMarian voiced understanding.

## 2016-06-24 ENCOUNTER — Ambulatory Visit: Payer: Medicare Other | Admitting: Family Medicine

## 2016-07-08 ENCOUNTER — Ambulatory Visit (INDEPENDENT_AMBULATORY_CARE_PROVIDER_SITE_OTHER)
Admission: RE | Admit: 2016-07-08 | Discharge: 2016-07-08 | Disposition: A | Discharge status: Home / Self Care | Payer: Medicare Other | Source: Ambulatory Visit | Attending: Family Medicine | Admitting: Family Medicine

## 2016-07-08 ENCOUNTER — Encounter: Payer: Self-pay | Admitting: Family Medicine

## 2016-07-08 ENCOUNTER — Ambulatory Visit (INDEPENDENT_AMBULATORY_CARE_PROVIDER_SITE_OTHER): Payer: Medicare Other | Admitting: Family Medicine

## 2016-07-08 VITALS — BP 158/78 | HR 68 | Temp 98.9°F | Wt 132.0 lb

## 2016-07-08 DIAGNOSIS — R634 Abnormal weight loss: Secondary | ICD-10-CM

## 2016-07-08 DIAGNOSIS — R454 Irritability and anger: Secondary | ICD-10-CM

## 2016-07-08 DIAGNOSIS — G3184 Mild cognitive impairment, so stated: Secondary | ICD-10-CM

## 2016-07-08 DIAGNOSIS — Z85038 Personal history of other malignant neoplasm of large intestine: Secondary | ICD-10-CM | POA: Diagnosis not present

## 2016-07-08 DIAGNOSIS — I10 Essential (primary) hypertension: Secondary | ICD-10-CM | POA: Diagnosis not present

## 2016-07-08 DIAGNOSIS — R011 Cardiac murmur, unspecified: Secondary | ICD-10-CM

## 2016-07-08 LAB — POCT URINALYSIS DIPSTICK
BILIRUBIN UA: NEGATIVE
Blood, UA: NEGATIVE
Glucose, UA: NEGATIVE
Ketones, UA: NEGATIVE
NITRITE UA: NEGATIVE
PH UA: 6 (ref 5.0–8.0)
Spec Grav, UA: 1.03 (ref 1.030–1.035)
UROBILINOGEN UA: NEGATIVE (ref ?–2.0)

## 2016-07-08 LAB — CBC WITH DIFFERENTIAL/PLATELET
BASOS PCT: 0.5 % (ref 0.0–3.0)
Basophils Absolute: 0 10*3/uL (ref 0.0–0.1)
EOS PCT: 0.9 % (ref 0.0–5.0)
Eosinophils Absolute: 0.1 10*3/uL (ref 0.0–0.7)
HEMATOCRIT: 42.6 % (ref 36.0–46.0)
Hemoglobin: 14.1 g/dL (ref 12.0–15.0)
LYMPHS PCT: 30 % (ref 12.0–46.0)
Lymphs Abs: 2.1 10*3/uL (ref 0.7–4.0)
MCHC: 33.2 g/dL (ref 30.0–36.0)
MCV: 89.4 fl (ref 78.0–100.0)
MONO ABS: 0.7 10*3/uL (ref 0.1–1.0)
Monocytes Relative: 9.7 % (ref 3.0–12.0)
Neutro Abs: 4.1 10*3/uL (ref 1.4–7.7)
Neutrophils Relative %: 58.9 % (ref 43.0–77.0)
Platelets: 255 10*3/uL (ref 150.0–400.0)
RBC: 4.76 Mil/uL (ref 3.87–5.11)
RDW: 14.8 % (ref 11.5–15.5)
WBC: 7 10*3/uL (ref 4.0–10.5)

## 2016-07-08 LAB — COMPREHENSIVE METABOLIC PANEL
ALK PHOS: 89 U/L (ref 39–117)
ALT: 12 U/L (ref 0–35)
AST: 19 U/L (ref 0–37)
Albumin: 4.5 g/dL (ref 3.5–5.2)
BUN: 31 mg/dL — ABNORMAL HIGH (ref 6–23)
CO2: 25 meq/L (ref 19–32)
Calcium: 10.3 mg/dL (ref 8.4–10.5)
Chloride: 104 mEq/L (ref 96–112)
Creatinine, Ser: 0.99 mg/dL (ref 0.40–1.20)
GFR: 67.83 mL/min (ref >60.00)
Glucose, Bld: 101 mg/dL — ABNORMAL HIGH (ref 70–99)
Potassium: 3.9 mEq/L (ref 3.5–5.1)
SODIUM: 137 meq/L (ref 135–145)
Total Bilirubin: 0.4 mg/dL (ref 0.2–1.2)
Total Protein: 7.7 g/dL (ref 6.0–8.3)

## 2016-07-08 LAB — TSH: TSH: 0.86 u[IU]/mL (ref 0.35–4.50)

## 2016-07-08 LAB — SEDIMENTATION RATE: SED RATE: 34 mm/h — AB (ref 0–30)

## 2016-07-08 MED ORDER — METOPROLOL SUCCINATE ER 25 MG PO TB24: 75.0000 mg | ORAL_TABLET | Freq: Every day | ORAL | 11 refills | Status: DC

## 2016-07-08 NOTE — Patient Instructions (Addendum)
Unexpected weight loss noted - we will further evaluate with labs, xray and urinalysis today.  Pass by lab to pick up stool kit.  Continue current medicines.  Return in 6-8 weeks for follow up visit and weight recheck.  Memory testing shows memory loss is present.  Good to see you today, call us with questions.

## 2016-07-08 NOTE — Progress Notes (Signed)
Pre visit review using our clinic review tool, if applicable. No additional management support is needed unless otherwise documented below in the visit note. 

## 2016-07-08 NOTE — Assessment & Plan Note (Addendum)
Chronic, deteriorated. bp elevated. Increase toprol XL to 75mg  daily. Continue amlodipine 10mg  daily.

## 2016-07-08 NOTE — Progress Notes (Signed)
BP (!) 158/78 (BP Location: Left Arm, Patient Position: Sitting, Cuff Size: Normal)   Pulse 68   Temp 98.9 F (37.2 C) (Oral)   Wt 132 lb (59.9 kg)   SpO2 97%   BMI 23.38 kg/m    CC: 6 mo f/u visit Subjective:    Patient ID: Erika Weiss, female    DOB: [birth year], 83 y.o.   MRN: [id number]  HPI: Erika R Ewing is a 83 y.o. female presenting on 07/08/2016 for Follow-up (6 months memory loss)   See prior note for details. Here with daughter today.  Memory loss - Namenda 5mg  daily started 04/2014 increased last year to 10mg  daily and sertraline 25mg  daily started 09/2015 for worsening irritability. Continues thinking that her father is alive - at times confuses husband with father.   12 lb weight loss noted over the past 6 months. She attributes to dental trouble - needs new dentures. She regularly takes MOM to "clean out" about every 2 weeks. Pt endorses home breast exams without concerns. Declines mammogram.   Denies fevers/chills, chest pain, dyspnea, dizziness, headache, coughing, night sweats, fatigue. No blood in stool or urine. No body aches or swollen glands.   Geriatric Assessment:  Activities of Daily Living:     Bathing- independent    Dressing- independent    Eating- independent    Toileting- independent    Transferring- independent    Continence- independent Overall Assessment: independent  Instrumental Activities of Daily Living:     Transportation- dependent    Meal/Food Preparation- independent    Shopping Errands- dependent    Housekeeping/Chores- independent    Money Management/Finances- dependent    Medication Management- independent    Ability to Use Telephone- independent    Laundry- independent Overall Assessment: largely dependent  Mental Status Exam: (value/max value) 19/30, 21 with cue (recall) Missed 6 out of orientation, 1 attention, 3 recall, and missed intersecting pentagons.     Clock Drawing Score: 2/4 - missed number positioning  and time  Relevant past medical, surgical, family and social history reviewed and updated as indicated. Interim medical history since our last visit reviewed. Allergies and medications reviewed and updated. Outpatient Medications Prior to Visit  Medication Sig Dispense Refill  . amLODipine (NORVASC) 10 MG tablet TAKE 1 TABLET DAILY 90 tablet 1  . docusate sodium (COLACE) 100 MG capsule Take 100 mg by mouth daily as needed for mild constipation.    . memantine (NAMENDA) 10 MG tablet Take 1 tablet (10 mg total) by mouth daily. 90 tablet 1  . naproxen sodium (ANAPROX) 220 MG tablet Take 220 mg by mouth as needed.    . sertraline (ZOLOFT) 25 MG tablet Take 1 tablet (25 mg total) by mouth daily. 90 tablet 1  . metoprolol succinate (TOPROL-XL) 50 MG 24 hr tablet Take 1 tablet (50 mg total) by mouth daily. Take with or immediately following a meal. 90 tablet 2   No facility-administered medications prior to visit.      Per HPI unless specifically indicated in ROS section below Review of Systems     Objective:    BP (!) 158/78 (BP Location: Left Arm, Patient Position: Sitting, Cuff Size: Normal)   Pulse 68   Temp 98.9 F (37.2 C) (Oral)   Wt 132 lb (59.9 kg)   SpO2 97%   BMI 23.38 kg/m   Wt Readings from Last 3 Encounters:  07/08/16 132 lb (59.9 kg)  12/23/15 144 lb 4 oz (65.4 kg)  12/16/15  142 lb 8 oz (64.6 kg)    Physical Exam  Constitutional: She is oriented to person, place, and time. She appears well-developed and well-nourished. No distress.  HENT:  Head: Normocephalic and atraumatic.  Mouth/Throat: Oropharynx is clear and moist. No oropharyngeal exudate.  Eyes: Conjunctivae and EOM are normal. Pupils are equal, round, and reactive to light. No scleral icterus.  Neck: Normal range of motion. Neck supple. No thyromegaly present.  Cardiovascular: Normal rate, regular rhythm and intact distal pulses.   Murmur (3/6 SEM USB) heard. Pulmonary/Chest: Effort normal and breath sounds  normal. No respiratory distress. She has no wheezes. She has no rales.  Abdominal: Soft. Bowel sounds are normal. She exhibits no distension and no mass. There is no tenderness. There is no rebound and no guarding.  Musculoskeletal: She exhibits no edema.  Lymphadenopathy:    She has no cervical adenopathy.       Right: No supraclavicular adenopathy present.       Left: No supraclavicular adenopathy present.  Neurological: She is alert and oriented to person, place, and time.  Skin: Skin is warm and dry. No rash noted.  Psychiatric: She has a normal mood and affect.  Nursing note and vitals reviewed.  Results for orders placed or performed in visit on 07/08/16  Comprehensive metabolic panel  Result Value Ref Range   Sodium 137 135 - 145 mEq/L   Potassium 3.9 3.5 - 5.1 mEq/L   Chloride 104 96 - 112 mEq/L   CO2 25 19 - 32 mEq/L   Glucose, Bld 101 (H) 70 - 99 mg/dL   BUN 31 (H) 6 - 23 mg/dL   Creatinine, Ser 1.61 0.40 - 1.20 mg/dL   Total Bilirubin 0.4 0.2 - 1.2 mg/dL   Alkaline Phosphatase 89 39 - 117 U/L   AST 19 0 - 37 U/L   ALT 12 0 - 35 U/L   Total Protein 7.7 6.0 - 8.3 g/dL   Albumin 4.5 3.5 - 5.2 g/dL   Calcium 09.6 8.4 - 04.5 mg/dL   GFR 40.98 >11.91 mL/min  CBC with Differential/Platelet  Result Value Ref Range   WBC 7.0 4.0 - 10.5 K/uL   RBC 4.76 3.87 - 5.11 Mil/uL   Hemoglobin 14.1 12.0 - 15.0 g/dL   HCT 47.8 29.5 - 62.1 %   MCV 89.4 78.0 - 100.0 fl   MCHC 33.2 30.0 - 36.0 g/dL   RDW 30.8 65.7 - 84.6 %   Platelets 255.0 150.0 - 400.0 K/uL   Neutrophils Relative % 58.9 43.0 - 77.0 %   Lymphocytes Relative 30.0 12.0 - 46.0 %   Monocytes Relative 9.7 3.0 - 12.0 %   Eosinophils Relative 0.9 0.0 - 5.0 %   Basophils Relative 0.5 0.0 - 3.0 %   Neutro Abs 4.1 1.4 - 7.7 K/uL   Lymphs Abs 2.1 0.7 - 4.0 K/uL   Monocytes Absolute 0.7 0.1 - 1.0 K/uL   Eosinophils Absolute 0.1 0.0 - 0.7 K/uL   Basophils Absolute 0.0 0.0 - 0.1 K/uL  TSH  Result Value Ref Range   TSH 0.86  0.35 - 4.50 uIU/mL  Sedimentation rate  Result Value Ref Range   Sed Rate 34 (H) 0 - 30 mm/hr  POCT urinalysis dipstick  Result Value Ref Range   Color, UA yellow    Clarity, UA clear    Glucose, UA negative    Bilirubin, UA negative    Ketones, UA negative    Spec Grav, UA 1.030 1.030 -  1.035   Blood, UA negative    pH, UA 6.0 5.0 - 8.0   Protein, UA trace    Urobilinogen, UA negative Negative - 2.0   Nitrite, UA negative    Leukocytes, UA Trace (A) Negative      Assessment & Plan:   Problem List Items Addressed This Visit    Amnestic MCI (mild cognitive impairment with memory loss)    Ongoing memory trouble evidenced by abnormal MMSE and clock drawing test, predominantly orientation and recall. Anticipate early Alz type. On namenda 10mg  daily. Consider addition of acetylcholinesterase inhibitor. RTC 1-2 mo f/u visit.       History of colon cancer    With weight loss, check iFOB.       Relevant Orders   Fecal occult blood, imunochemical   DG Chest 2 View (Completed)   Hypertension    Chronic, deteriorated. bp elevated. Increase toprol XL to 75mg  daily. Continue amlodipine 10mg  daily.       Relevant Medications   metoprolol succinate (TOPROL-XL) 25 MG 24 hr tablet   Irritability and anger    Stable period with intermittent irritability endorsed by family - continue sertraline 25mg  daily.       Systolic murmur    Stable.       Weight loss - Primary    Unexpected 12 lb weight loss noted over last 6 months. Discussed with pt and daughter. No focal symptoms, endorses good appetite. Will start evaluation with labwork today, CXR, UA and iFOB. RTC 1-2 mo close f/u visit. Pt/daughter agree with plan.       Relevant Orders   Fecal occult blood, imunochemical   DG Chest 2 View (Completed)   Comprehensive metabolic panel (Completed)   CBC with Differential/Platelet (Completed)   TSH (Completed)   Sedimentation rate (Completed)   POCT urinalysis dipstick (Completed)        Follow up plan: Return in about 6 weeks (around 08/19/2016), or if symptoms worsen or fail to improve, for follow up visit.  Eustaquio BoydenJavier Orlena Garmon, MD

## 2016-07-10 DIAGNOSIS — R634 Abnormal weight loss: Secondary | ICD-10-CM | POA: Insufficient documentation

## 2016-07-10 NOTE — Assessment & Plan Note (Signed)
Unexpected 12 lb weight loss noted over last 6 months. Discussed with pt and daughter. No focal symptoms, endorses good appetite. Will start evaluation with labwork today, CXR, UA and iFOB. RTC 1-2 mo close f/u visit. Pt/daughter agree with plan.

## 2016-07-10 NOTE — Assessment & Plan Note (Signed)
With weight loss, check iFOB.

## 2016-07-10 NOTE — Assessment & Plan Note (Signed)
Stable period with intermittent irritability endorsed by family - continue sertraline 25mg  daily.

## 2016-07-10 NOTE — Assessment & Plan Note (Addendum)
Ongoing memory trouble evidenced by abnormal MMSE and clock drawing test, predominantly orientation and recall. Anticipate early Alz type. On namenda  daily. Consider addition of acetylcholinesterase inhibitor. RTC 1-2 mo f/u visit.

## 2016-07-10 NOTE — Assessment & Plan Note (Signed)
Stable

## 2016-07-11 ENCOUNTER — Encounter: Payer: Self-pay | Admitting: *Deleted

## 2016-07-15 ENCOUNTER — Telehealth: Payer: Self-pay | Admitting: Family Medicine

## 2016-07-15 NOTE — Telephone Encounter (Signed)
Patient's daughter,Marion,called.  She said patient is unable to do the Cologuard test. She didn't understand the instructions and her family was unable to be with her to help her.

## 2016-07-15 NOTE — Telephone Encounter (Signed)
We did iFOB for weight loss, not cologuard. Noted.  Will discuss in office hemoccult card at f/u visit next month.

## 2016-08-10 ENCOUNTER — Other Ambulatory Visit: Payer: Self-pay | Admitting: *Deleted

## 2016-08-10 MED ORDER — METOPROLOL SUCCINATE ER 25 MG PO TB24: 75.0000 mg | ORAL_TABLET | Freq: Every day | ORAL | 2 refills | Status: DC

## 2016-08-19 ENCOUNTER — Ambulatory Visit (INDEPENDENT_AMBULATORY_CARE_PROVIDER_SITE_OTHER): Payer: Medicare Other | Admitting: Family Medicine

## 2016-08-19 ENCOUNTER — Encounter: Payer: Self-pay | Admitting: Family Medicine

## 2016-08-19 VITALS — BP 166/80 | HR 60 | Temp 98.1°F | Wt 130.8 lb

## 2016-08-19 DIAGNOSIS — F0391 Unspecified dementia with behavioral disturbance: Secondary | ICD-10-CM | POA: Diagnosis not present

## 2016-08-19 DIAGNOSIS — I10 Essential (primary) hypertension: Secondary | ICD-10-CM | POA: Diagnosis not present

## 2016-08-19 DIAGNOSIS — R454 Irritability and anger: Secondary | ICD-10-CM | POA: Diagnosis not present

## 2016-08-19 DIAGNOSIS — R634 Abnormal weight loss: Secondary | ICD-10-CM

## 2016-08-19 MED ORDER — DONEPEZIL HCL 5 MG PO TABS: 5.0000 mg | ORAL_TABLET | Freq: Every day | ORAL | 3 refills | Status: DC

## 2016-08-19 NOTE — Patient Instructions (Addendum)
Try aricept  in the evenings - sent to pharmacy.  Continue other medicines as up to now.  Trial increase metoprolol XL to  daily - this may cause slowing of heart rate so if any worsening fatigue, dizziness, passing out, return to  (2 tablets) of metoprolol XL.  Stool test today normal.  Return as needed or in 2 months for follow up visit.

## 2016-08-19 NOTE — Progress Notes (Signed)
Pre visit review using our clinic review tool, if applicable. No additional management support is needed unless otherwise documented below in the visit note. 

## 2016-08-19 NOTE — Assessment & Plan Note (Signed)
Anticipate progression - will start aricept  daily, continue namenda  daily. Hopeful aricept will help with night time agitation. rec avoid other sedating meds at this time.

## 2016-08-19 NOTE — Assessment & Plan Note (Signed)
Chronic, deteriorated. She did not yet start higher toprol XL dose - confusion over dosing.  Discussed antihypertensive regimen - will continue amlodipine  daily, trial toprol XL  daily. Discussed monitoring for symptomatic bradycardia.

## 2016-08-19 NOTE — Progress Notes (Signed)
BP (!) 166/80   Pulse 60   Temp 98.1 F (36.7 C) (Oral)   Wt 130 lb 12 oz (59.3 kg)   BMI 23.16 kg/m    CC: 1 mo f/u visit Subjective:    Patient ID: [place] R Nabor, female    DOB: 18-May-1924, 83 y.o.   MRN: [id number]  HPI: [place] R Brasher is a 83 y.o. female presenting on 08/19/2016 for Follow-up   See prior note for details.  On namenda  daily, sertraline  daily. Started 09/2015.  Ongoing weight loss. iFOB was not returned - they were unable to complete at home due to cognitive difficulty.   Increasing confusion and agitation at night ?sundowning. They ask about PRN med.   Patient thinks she has trouble adjusting to "new" dentures. Daughters verify these are old dentures. Saw dentist recently - told needed new dentures as current old ones don't fit well.   Nutritional supplement [employer] supplement started last week. Taking 1-2 daily.  HTN - last visit we increased toprol XL to  daily, continued amlodipine  daily. She did not increase toprol XL yet - has only been taking  dose.   Relevant past medical, surgical, family and social history reviewed and updated as indicated. Interim medical history since our last visit reviewed. Allergies and medications reviewed and updated. Outpatient Medications Prior to Visit  Medication Sig Dispense Refill  . amLODipine (NORVASC) 10 MG tablet TAKE 1 TABLET DAILY 90 tablet 1  . cyanocobalamin 500 MCG tablet Take 500 mcg by mouth daily.    Marland Kitchen docusate sodium (COLACE) 100 MG capsule Take 100 mg by mouth daily as needed for mild constipation.    . magnesium hydroxide (MILK OF MAGNESIA) 400 MG/5ML suspension Take by mouth as needed for mild constipation.    . memantine (NAMENDA) 10 MG tablet Take 1 tablet (10 mg total) by mouth daily. 90 tablet 1  . metoprolol succinate (TOPROL-XL) 25 MG 24 hr tablet Take 3 tablets (75 mg total) by mouth daily. Take with or immediately following a meal. 270 tablet 2  . naproxen sodium  (ANAPROX) 220 MG tablet Take 220 mg by mouth as needed.    . sertraline (ZOLOFT) 25 MG tablet Take 1 tablet (25 mg total) by mouth daily. 90 tablet 1   No facility-administered medications prior to visit.      Per HPI unless specifically indicated in ROS section below Review of Systems     Objective:    BP (!) 166/80   Pulse 60   Temp 98.1 F (36.7 C) (Oral)   Wt 130 lb 12 oz (59.3 kg)   BMI 23.16 kg/m   Wt Readings from Last 3 Encounters:  08/19/16 130 lb 12 oz (59.3 kg)  07/08/16 132 lb (59.9 kg)  12/23/15 144 lb 4 oz (65.4 kg)    Physical Exam  Constitutional: She appears well-developed and well-nourished. No distress.  HENT:  Mouth/Throat: Oropharynx is clear and moist. No oropharyngeal exudate.  Cardiovascular: Normal rate, regular rhythm, normal heart sounds and intact distal pulses.   No murmur heard. Pulmonary/Chest: Effort normal and breath sounds normal. No respiratory distress. She has no wheezes. She has no rales.  Genitourinary: Rectum normal. Rectal exam shows no external hemorrhoid, no internal hemorrhoid, no fissure, no mass, no tenderness, anal tone normal and guaiac negative stool.  Musculoskeletal: She exhibits no edema.  Skin: Skin is warm and dry. No rash noted.  Psychiatric: She has a normal mood and affect.  Memory loss  present  Nursing note and vitals reviewed.  Results for orders placed or performed in visit on 07/08/16  Comprehensive metabolic panel  Result Value Ref Range   Sodium 137 135 - 145 mEq/L   Potassium 3.9 3.5 - 5.1 mEq/L   Chloride 104 96 - 112 mEq/L   CO2 25 19 - 32 mEq/L   Glucose, Bld 101 (H) 70 - 99 mg/dL   BUN 31 (H) 6 - 23 mg/dL   Creatinine, Ser 1.61 0.40 - 1.20 mg/dL   Total Bilirubin 0.4 0.2 - 1.2 mg/dL   Alkaline Phosphatase 89 39 - 117 U/L   AST 19 0 - 37 U/L   ALT 12 0 - 35 U/L   Total Protein 7.7 6.0 - 8.3 g/dL   Albumin 4.5 3.5 - 5.2 g/dL   Calcium 09.6 8.4 - 04.5 mg/dL   GFR 40.98 >11.91 mL/min  CBC with  Differential/Platelet  Result Value Ref Range   WBC 7.0 4.0 - 10.5 K/uL   RBC 4.76 3.87 - 5.11 Mil/uL   Hemoglobin 14.1 12.0 - 15.0 g/dL   HCT 47.8 29.5 - 62.1 %   MCV 89.4 78.0 - 100.0 fl   MCHC 33.2 30.0 - 36.0 g/dL   RDW 30.8 65.7 - 84.6 %   Platelets 255.0 150.0 - 400.0 K/uL   Neutrophils Relative % 58.9 43.0 - 77.0 %   Lymphocytes Relative 30.0 12.0 - 46.0 %   Monocytes Relative 9.7 3.0 - 12.0 %   Eosinophils Relative 0.9 0.0 - 5.0 %   Basophils Relative 0.5 0.0 - 3.0 %   Neutro Abs 4.1 1.4 - 7.7 K/uL   Lymphs Abs 2.1 0.7 - 4.0 K/uL   Monocytes Absolute 0.7 0.1 - 1.0 K/uL   Eosinophils Absolute 0.1 0.0 - 0.7 K/uL   Basophils Absolute 0.0 0.0 - 0.1 K/uL  TSH  Result Value Ref Range   TSH 0.86 0.35 - 4.50 uIU/mL  Sedimentation rate  Result Value Ref Range   Sed Rate 34 (H) 0 - 30 mm/hr  POCT urinalysis dipstick  Result Value Ref Range   Color, UA yellow    Clarity, UA clear    Glucose, UA negative    Bilirubin, UA negative    Ketones, UA negative    Spec Grav, UA 1.030 1.030 - 1.035   Blood, UA negative    pH, UA 6.0 5.0 - 8.0   Protein, UA trace    Urobilinogen, UA negative Negative - 2.0   Nitrite, UA negative    Leukocytes, UA Trace (A) Negative      Assessment & Plan:   Problem List Items Addressed This Visit    Dementia - Primary    Anticipate progression - will start aricept  daily, continue namenda  daily. Hopeful aricept will help with night time agitation. rec avoid other sedating meds at this time.       Relevant Medications   donepezil (ARICEPT) 5 MG tablet   Hypertension    Chronic, deteriorated. She did not yet start higher toprol XL dose - confusion over dosing.  Discussed antihypertensive regimen - will continue amlodipine  daily, trial toprol XL  daily. Discussed monitoring for symptomatic bradycardia.       Irritability and anger    Ongoing trouble despite sertraline. See below.      Weight loss    Ongoing but slowing  down - possibly as patient has started [employer] nutritional supplement daily. Unable to complete home iFOB. Rectal exam today  with neg stool card today.  Labs stable, as were urinalysis, CXR.  Will continue to monitor, RTC 2 mo f/u visit.          Follow up plan: Return in about 2 months (around 10/19/2016), or if symptoms worsen or fail to improve, for follow up visit.  Eustaquio Boyden, MD

## 2016-08-19 NOTE — Assessment & Plan Note (Signed)
Ongoing but slowing down - possibly as patient has started [employer] brand nutritional supplement daily. Unable to complete home iFOB. Rectal exam today with neg stool card today.  Labs stable, as were urinalysis, CXR.  Will continue to monitor, RTC 2 mo f/u visit.

## 2016-08-19 NOTE — Assessment & Plan Note (Signed)
Ongoing trouble despite sertraline. See below.

## 2016-08-23 ENCOUNTER — Ambulatory Visit (INDEPENDENT_AMBULATORY_CARE_PROVIDER_SITE_OTHER)
Admission: RE | Admit: 2016-08-23 | Discharge: 2016-08-23 | Disposition: A | Discharge status: Home / Self Care | Payer: Medicare Other | Source: Ambulatory Visit | Attending: Family Medicine | Admitting: Family Medicine

## 2016-08-23 ENCOUNTER — Encounter: Payer: Self-pay | Admitting: Family Medicine

## 2016-08-23 ENCOUNTER — Ambulatory Visit (INDEPENDENT_AMBULATORY_CARE_PROVIDER_SITE_OTHER): Payer: Medicare Other | Admitting: Family Medicine

## 2016-08-23 VITALS — BP 160/72 | HR 68 | Temp 98.6°F | Wt 132.2 lb

## 2016-08-23 DIAGNOSIS — S8992XA Unspecified injury of left lower leg, initial encounter: Secondary | ICD-10-CM | POA: Insufficient documentation

## 2016-08-23 NOTE — Progress Notes (Signed)
Pre visit review using our clinic review tool, if applicable. No additional management support is needed unless otherwise documented below in the visit note. 

## 2016-08-23 NOTE — Patient Instructions (Addendum)
I don't see an obvious knee fracture. Continue ice to knee, rest and elevation of knee, and should continue to slowly improve. We will call you if any change in plan based on radiology read.

## 2016-08-23 NOTE — Progress Notes (Signed)
BP (!) 160/72   Pulse 68   Temp 98.6 F (37 C) (Oral)   Wt 132 lb 4 oz (60 kg)   BMI 23.43 kg/m    CC: fall  Subjective:    Patient ID: Erika Weiss, female    DOB: 10-Jul-1924, 83 y.o.   MRN: [id number]  HPI: Erika Weiss is a 83 y.o. female presenting on 08/23/2016 for Fall (Saturday night, fell off porch down the steps)   Saturday evening fell off porch and down steps. Unwitnessed fall when - she was alone with husband. She fell down porch down a few steps into yard. Injured L knee and and L face and cheek. Thinks was able to get back up and into house. She's been running creams onto left knee. Initially with swelling and warmth, knee slowly improving. Pt denies dizziness or lightheadedness with fall. She thinks she tripped over pulse outdoors.   Ongoing knee pain. Denies facial pain.  We did recently increase toprol XL to  daily.   Relevant past medical, surgical, family and social history reviewed and updated as indicated. Interim medical history since our last visit reviewed. Allergies and medications reviewed and updated. Outpatient Medications Prior to Visit  Medication Sig Dispense Refill  . amLODipine (NORVASC) 10 MG tablet TAKE 1 TABLET DAILY 90 tablet 1  . cyanocobalamin 500 MCG tablet Take 500 mcg by mouth daily.    Marland Kitchen docusate sodium (COLACE) 100 MG capsule Take 100 mg by mouth daily as needed for mild constipation.    Marland Kitchen donepezil (ARICEPT) 5 MG tablet Take 1 tablet (5 mg total) by mouth at bedtime. 30 tablet 3  . magnesium hydroxide (MILK OF MAGNESIA) 400 MG/5ML suspension Take by mouth as needed for mild constipation.    . memantine (NAMENDA) 10 MG tablet Take 1 tablet (10 mg total) by mouth daily. 90 tablet 1  . metoprolol succinate (TOPROL-XL) 25 MG 24 hr tablet Take 3 tablets (75 mg total) by mouth daily. Take with or immediately following a meal. 270 tablet 2  . naproxen sodium (ANAPROX) 220 MG tablet Take 220 mg by mouth as needed.    . sertraline  (ZOLOFT) 25 MG tablet Take 1 tablet (25 mg total) by mouth daily. 90 tablet 1   No facility-administered medications prior to visit.      Per HPI unless specifically indicated in ROS section below Review of Systems     Objective:    BP (!) 160/72   Pulse 68   Temp 98.6 F (37 C) (Oral)   Wt 132 lb 4 oz (60 kg)   BMI 23.43 kg/m   Wt Readings from Last 3 Encounters:  08/23/16 132 lb 4 oz (60 kg)  08/19/16 130 lb 12 oz (59.3 kg)  07/08/16 132 lb (59.9 kg)    Physical Exam  Constitutional: She appears well-developed and well-nourished. No distress.  HENT:  Mouth/Throat: Oropharynx is clear and moist. No oropharyngeal exudate.  Bruising without bony tenderness L chin as well as L cheek and below L eye  Eyes: Conjunctivae are normal. Pupils are equal, round, and reactive to light.  Neck: Normal range of motion. No thyromegaly present.  Cardiovascular: Normal rate, regular rhythm and intact distal pulses.   Murmur (3/6 SEM LUSB) heard. Pulmonary/Chest: Effort normal and breath sounds normal. No respiratory distress. She has no wheezes. She has no rales.  Musculoskeletal: She exhibits edema.  R knee with minor abrasion without significant bruising, o/w WNL L Knee exam: No deformity  on inspection. Mild pain with palpation of anterior, medial, lateral knee. + effusion/swelling noted as well as marked bruising/ecchymosis throughout L knee, no erythema or warmth FROM in flex/extension with crepitus. No popliteal fullness. Neg drawer test. Neg mcmurray test. No pain with valgus/varus stress. No PFgrind. No abnormal patellar mobility.   Skin: Skin is warm and dry. Bruising and ecchymosis noted.  Nursing note and vitals reviewed.  LEFT KNEE - COMPLETE 4+ VIEW COMPARISON:  None in PACs FINDINGS: The bones are subjectively osteopenic. There is moderate joint space loss medially with milder loss laterally. Osteophytes arise from the articular margins of the tibial plateaus. There  is no tibial plateau fracture. There are also spurs arising from the tibial spines, femoral condyles, and the articular margins of the patella. IMPRESSION: Chronic moderate to severe osteoarthritic changes involving all 3 joint compartments with the medial compartment being most significantly involved. No acute fracture or dislocation is observed Electronically Signed   By: David  [place] M.D.   On: 08/23/2016 13:20    Assessment & Plan:   Problem List Items Addressed This Visit    Injury of left knee - Primary    Knee contusion and anticipate knee strain after unwitnessed fall at porch at home thought mechanical etiology. Xray without obvious fracture on my read - will await radiology evaluation. Significant degenerative arthritis changes however.  rec tylenol, ice pack to knee, rest, elevation, discussed compression with knee sleeve.  Ligaments tested intact.  Update if not improving as expected.       Relevant Orders   DG Knee Complete 4 Views Left (Completed)       Follow up plan: No Follow-up on file.  Eustaquio Boyden, MD

## 2016-08-23 NOTE — Assessment & Plan Note (Addendum)
Knee contusion and anticipate knee strain after unwitnessed fall at porch at home thought mechanical etiology. Xray without obvious fracture on my read - will await radiology evaluation. Significant degenerative arthritis changes however.  rec tylenol, ice pack to knee, rest, elevation, discussed compression with knee sleeve.  Ligaments tested intact.  Update if not improving as expected.

## 2016-08-26 ENCOUNTER — Encounter: Payer: Self-pay | Admitting: *Deleted

## 2016-09-05 ENCOUNTER — Telehealth: Payer: Self-pay | Admitting: Family Medicine

## 2016-09-05 MED ORDER — QUETIAPINE FUMARATE 25 MG PO TABS: 25.0000 mg | ORAL_TABLET | Freq: Every day | ORAL | 1 refills | Status: DC

## 2016-09-05 NOTE — Telephone Encounter (Signed)
[facility] - Client TELEPHONE ADVICE RECORD [facility]  Patient Name: Erika Weiss  DOB: 1925-01-09    Initial Comment Caller states mother has Alzheimer's, asking if she can get meds for agitation    Nurse Assessment  Nurse: Reed Pandyamsey, RN, Amy Date/Time Lamount Cohen(Eastern Time): 09/05/2016 2:21:46 PM  Confirm and document reason for call. If symptomatic, describe symptoms. ---Caller states her mom has Alzheimer's and was given medicine for agitation at night but it's not working and she's wanting a different medicine called in. This nurse did notice that the patient's daughter spoke with a nurse this morning for a complaint of new onset confusion and it was advised her mother was seen within 4 hours but the patient's daughter is saying she doesn't have an appointment. Does not want to be triaged at this time either, just wanting a new medicine called in. This nurse advised that the physician would not call in a new medicine without seeing her mother first and we could gladly make an appointment for her to be seen. The patient's daughter verbalized understanding.  Does the patient have any new or worsening symptoms? ---Yes  Will a triage be completed? ---No  Select reason for no triage. ---Patient declined  Please document clinical information provided and list any resource used. ---Patient's daughter declined.     Guidelines    Guideline Title Affirmed Question Affirmed Notes       Final Disposition User        Comments  Appt scheduled with Dr. Sharen HonesGutierrez for 09/06/16 @ 3:[facility]pm [facility]. Caller voiced understanding.

## 2016-09-05 NOTE — Telephone Encounter (Signed)
Pt has appt with Dr Reece AgarG on 09/06/16 at 3:45.

## 2016-09-05 NOTE — Telephone Encounter (Signed)
We can try seroquel 25mg  at bedtime. This may help with sleep and wandering some but it does have increased mortality risk associated with it.  Did aricept 5mg  not help symptoms at all? We could alternatively continue to increase this dose.

## 2016-09-05 NOTE — Telephone Encounter (Signed)
Patient Name: Erika Weiss  DOB: 1924/09/02    Initial Comment Caller states that her mom has dementia and she is more agitated in the evenings. They want to know if they can get more medication. She walks off and confused.    Nurse Assessment  Nurse: Stefano GaulStringer, RN, Dwana CurdVera Date/Time (Eastern Time): 09/05/2016 8:35:51 AM  Confirm and document reason for call. If symptomatic, describe symptoms. ---Caller states mother has dementia and is getting agitated in the evening. She walked out of the house and was found in the street last night She is wanting to get medication to calm her down.  Does the patient have any new or worsening symptoms? ---Yes  Will a triage be completed? ---Yes  Related visit to physician within the last 2 weeks? ---Yes  Does the PT have any chronic conditions? (i.e. diabetes, asthma, etc.) ---Yes  List chronic conditions. ---dementia; alzheimers; HTN  Is this a behavioral health or substance abuse call? ---Yes  Are you having any thoughts or feelings of harming or killing yourself or someone else? ---No  Are you currently experiencing any physical discomfort that you think may be related to the use of alcohol or other drugs? (use substance abuse or alcohol abuse guidelines. These include withdrawal symptoms) ---No  Do you worry that you may be hearing or seeing things that others do not? ---Yes  Do you take medications for your condition(s)? ---Yes  List medications here. ---does not know     Guidelines    Guideline Title Affirmed Question Affirmed Notes  Confusion - Delirium [1] Longstanding confusion (e.g., dementia, stroke) AND [2] worsening    Final Disposition User   See Physician within 4 Hours (or PCP triage) Stefano GaulStringer, RN, Vera    Comments  caller does not want to bring mother in for appt as she was in the office last week. She is wanting medication called in to calm her down as she is getting agitated in the evening. She is leaving the house and was in the street  last night and a neighbor found her. Please call daughter back for medication   Referrals  GO TO FACILITY REFUSED   Disagree/Comply: Comply

## 2016-09-05 NOTE — Telephone Encounter (Signed)
Spoke with doris and Stanton Kidneydebra - twin daughters of [place]. Will try melatonin - if ineffective, may try seroquel 25mg  at night, discussed possible increased mortality with med.  [facility] was mistaken - pt does not need OV.

## 2016-09-06 ENCOUNTER — Ambulatory Visit: Payer: Self-pay | Admitting: Family Medicine

## 2016-09-06 MED ORDER — DONEPEZIL HCL 10 MG PO TABS: 10.0000 mg | ORAL_TABLET | Freq: Every day | ORAL | 3 refills | Status: DC

## 2016-09-06 MED ORDER — MELATONIN 5 MG PO TABS: 5.0000 mg | ORAL_TABLET | Freq: Every day | ORAL | 0 refills | Status: DC

## 2016-09-06 NOTE — Telephone Encounter (Signed)
Per Angelica ChessmanMandy; ok to cancel 5/15 appt

## 2016-09-06 NOTE — Addendum Note (Signed)
Addended by: Eustaquio BoydenGUTIERREZ, Aydia Maj on: 09/06/2016 05:53 PM   Modules accepted: Orders

## 2016-09-06 NOTE — Telephone Encounter (Signed)
Noted. Will send in higher aricept dose 10mg  daily. We also discussed trial melatonin yesterday when I called them.

## 2016-09-06 NOTE — Telephone Encounter (Signed)
Daughter read the side eff of seroquel and she said they are not going to pick that one up. She said the aricept did help a little but it seemed to wear off so daughter would like to try an increased dose of the aricept

## 2016-09-24 ENCOUNTER — Other Ambulatory Visit: Payer: Self-pay | Admitting: Family Medicine

## 2016-10-20 ENCOUNTER — Encounter: Payer: Self-pay | Admitting: Family Medicine

## 2016-10-20 ENCOUNTER — Ambulatory Visit (INDEPENDENT_AMBULATORY_CARE_PROVIDER_SITE_OTHER): Payer: Medicare Other | Admitting: Family Medicine

## 2016-10-20 VITALS — BP 148/82 | HR 50 | Temp 97.9°F | Wt 131.2 lb

## 2016-10-20 DIAGNOSIS — R454 Irritability and anger: Secondary | ICD-10-CM | POA: Diagnosis not present

## 2016-10-20 DIAGNOSIS — R634 Abnormal weight loss: Secondary | ICD-10-CM | POA: Diagnosis not present

## 2016-10-20 DIAGNOSIS — I10 Essential (primary) hypertension: Secondary | ICD-10-CM | POA: Diagnosis not present

## 2016-10-20 DIAGNOSIS — F0391 Unspecified dementia with behavioral disturbance: Secondary | ICD-10-CM

## 2016-10-20 MED ORDER — AMLODIPINE BESYLATE 10 MG PO TABS: 10.0000 mg | ORAL_TABLET | Freq: Every day | ORAL | 1 refills | Status: DC

## 2016-10-20 MED ORDER — SERTRALINE HCL 25 MG PO TABS: 25.0000 mg | ORAL_TABLET | Freq: Every day | ORAL | 1 refills | Status: DC

## 2016-10-20 MED ORDER — DONEPEZIL HCL 10 MG PO TABS: 10.0000 mg | ORAL_TABLET | Freq: Every day | ORAL | 1 refills | Status: DC

## 2016-10-20 MED ORDER — QUETIAPINE FUMARATE 25 MG PO TABS: 25.0000 mg | ORAL_TABLET | Freq: Every day | ORAL | 1 refills | Status: DC

## 2016-10-20 NOTE — Assessment & Plan Note (Signed)
Stable period on sertraline. Pt/daughter endorse stable relationship with husband.

## 2016-10-20 NOTE — Progress Notes (Signed)
BP (!) 148/82   Pulse (!) 50   Temp 97.9 F (36.6 C) (Oral)   Wt 131 lb 4 oz (59.5 kg)   SpO2 97%   BMI 23.25 kg/m    CC: 6 wk f/u visit Subjective:    Patient ID: Erika Weiss, female    DOB: [birth year], 83 y.o.   MRN: [id number]  HPI: Erika Weiss is a 83 y.o. female presenting on 10/20/2016 for Follow-up   See recent phone notes for details. We started seroquel 25mg  nightly for worsening insomnia after melatonin and other treatments were ineffective. No improvement after higher aricept dose. Increased confusion and agitation at night likely sundowning - better on seroquel. Currently taking both melatonin and seroquel.   Knee feels better.  Denies dizziness, dyspnea or palpitations despite noted bradycardia.   On namenda 10mg  daily, aricept 10mg  daily, sertraline 25mg  daily.  Weight has stabilized. Unable to complete iFOB at home.   Relevant past medical, surgical, family and social history reviewed and updated as indicated. Interim medical history since our last visit reviewed. Allergies and medications reviewed and updated. Outpatient Medications Prior to Visit  Medication Sig Dispense Refill  . cyanocobalamin 500 MCG tablet Take 500 mcg by mouth daily.    . magnesium hydroxide (MILK OF MAGNESIA) 400 MG/5ML suspension Take by mouth as needed for mild constipation.    . Melatonin 5 MG TABS Take 1 tablet (5 mg total) by mouth at bedtime.  0  . memantine (NAMENDA) 10 MG tablet TAKE 1 TABLET DAILY 90 tablet 2  . metoprolol succinate (TOPROL-XL) 25 MG 24 hr tablet Take 3 tablets (75 mg total) by mouth daily. Take with or immediately following a meal. 270 tablet 2  . naproxen sodium (ANAPROX) 220 MG tablet Take 220 mg by mouth as needed.    Marland Kitchen. amLODipine (NORVASC) 10 MG tablet TAKE 1 TABLET DAILY 90 tablet 1  . docusate sodium (COLACE) 100 MG capsule Take 100 mg by mouth daily as needed for mild constipation.    Marland Kitchen. donepezil (ARICEPT) 10 MG tablet Take 1 tablet (10 mg  total) by mouth at bedtime. 30 tablet 3  . sertraline (ZOLOFT) 25 MG tablet Take 1 tablet (25 mg total) by mouth daily. 90 tablet 1   No facility-administered medications prior to visit.      Per HPI unless specifically indicated in ROS section below Review of Systems     Objective:    BP (!) 148/82   Pulse (!) 50   Temp 97.9 F (36.6 C) (Oral)   Wt 131 lb 4 oz (59.5 kg)   SpO2 97%   BMI 23.25 kg/m   Wt Readings from Last 3 Encounters:  10/20/16 131 lb 4 oz (59.5 kg)  08/23/16 132 lb 4 oz (60 kg)  08/19/16 130 lb 12 oz (59.3 kg)    Physical Exam  Constitutional: She appears well-developed and well-nourished. No distress.  HENT:  Mouth/Throat: Oropharynx is clear and moist. No oropharyngeal exudate.  Cardiovascular: Normal rate, regular rhythm and intact distal pulses.   Murmur (3/6 SEM) heard. Pulmonary/Chest: Effort normal and breath sounds normal. No respiratory distress. She has no wheezes. She has no rales.  Musculoskeletal: She exhibits no edema.  FROM bilateral knees with crepitus L>R  Skin: Skin is warm and dry. No rash noted.  Psychiatric: She has a normal mood and affect.  Nursing note and vitals reviewed.  Results for orders placed or performed in visit on 07/08/16  Comprehensive metabolic panel  Result Value Ref Range   Sodium 137 135 - 145 mEq/L   Potassium 3.9 3.5 - 5.1 mEq/L   Chloride 104 96 - 112 mEq/L   CO2 25 19 - 32 mEq/L   Glucose, Bld 101 (H) 70 - 99 mg/dL   BUN 31 (H) 6 - 23 mg/dL   Creatinine, Ser 1.61 0.40 - 1.20 mg/dL   Total Bilirubin 0.4 0.2 - 1.2 mg/dL   Alkaline Phosphatase 89 39 - 117 U/L   AST 19 0 - 37 U/L   ALT 12 0 - 35 U/L   Total Protein 7.7 6.0 - 8.3 g/dL   Albumin 4.5 3.5 - 5.2 g/dL   Calcium 09.6 8.4 - 04.5 mg/dL   GFR 40.98 >11.91 mL/min  CBC with Differential/Platelet  Result Value Ref Range   WBC 7.0 4.0 - 10.5 K/uL   RBC 4.76 3.87 - 5.11 Mil/uL   Hemoglobin 14.1 12.0 - 15.0 g/dL   HCT 47.8 29.5 - 62.1 %   MCV  89.4 78.0 - 100.0 fl   MCHC 33.2 30.0 - 36.0 g/dL   RDW 30.8 65.7 - 84.6 %   Platelets 255.0 150.0 - 400.0 K/uL   Neutrophils Relative % 58.9 43.0 - 77.0 %   Lymphocytes Relative 30.0 12.0 - 46.0 %   Monocytes Relative 9.7 3.0 - 12.0 %   Eosinophils Relative 0.9 0.0 - 5.0 %   Basophils Relative 0.5 0.0 - 3.0 %   Neutro Abs 4.1 1.4 - 7.7 K/uL   Lymphs Abs 2.1 0.7 - 4.0 K/uL   Monocytes Absolute 0.7 0.1 - 1.0 K/uL   Eosinophils Absolute 0.1 0.0 - 0.7 K/uL   Basophils Absolute 0.0 0.0 - 0.1 K/uL  TSH  Result Value Ref Range   TSH 0.86 0.35 - 4.50 uIU/mL  Sedimentation rate  Result Value Ref Range   Sed Rate 34 (H) 0 - 30 mm/hr  POCT urinalysis dipstick  Result Value Ref Range   Color, UA yellow    Clarity, UA clear    Glucose, UA negative    Bilirubin, UA negative    Ketones, UA negative    Spec Grav, UA 1.030 1.030 - 1.035   Blood, UA negative    pH, UA 6.0 5.0 - 8.0   Protein, UA trace    Urobilinogen, UA negative Negative - 2.0   Nitrite, UA negative    Leukocytes, UA Trace (A) Negative      Assessment & Plan:   Problem List Items Addressed This Visit    Dementia - Primary    Stable period on namenda 10mg  daily and aricept 10mg  nightly. Last visit we had to add seroquel 25mg  nightly for sundowning. She also continues melatonin 5mg  at bedtime. Continue current regimen. Discussed benefits vs risks of antipsychotic.       Relevant Medications   donepezil (ARICEPT) 10 MG tablet   sertraline (ZOLOFT) 25 MG tablet   QUEtiapine (SEROQUEL) 25 MG tablet   Hypertension    Chronic, adequate. Continue current regimen. Monitor bradycardia - asxs per patient.      Relevant Medications   amLODipine (NORVASC) 10 MG tablet   Irritability and anger    Stable period on sertraline. Pt/daughter endorse stable relationship with husband.       Weight loss    This seems to have stabilized, may be some better with seroquel addition.           Follow up plan: Return in about 3  months (around 01/20/2017)  for annual exam, prior fasting for blood work, medicare wellness visit.  Ria Bush, MD

## 2016-10-20 NOTE — Assessment & Plan Note (Addendum)
Stable period on namenda 10mg  daily and aricept 10mg  nightly. Last visit we had to add seroquel 25mg  nightly for sundowning. She also continues melatonin 5mg  at bedtime. Continue current regimen. Discussed benefits vs risks of antipsychotic.

## 2016-10-20 NOTE — Assessment & Plan Note (Signed)
Chronic, adequate. Continue current regimen. Monitor bradycardia - asxs per patient.

## 2016-10-20 NOTE — Patient Instructions (Addendum)
You are doing well today Continue current medicines. Return as needed or in 3-4 months for medicare wellness visit with Randa EvensLesia Pinson and then physical with me.  Try skipping quetiapine (seroquel) for sleep every few nights to see if truly needed.

## 2016-10-20 NOTE — Assessment & Plan Note (Signed)
This seems to have stabilized, may be some better with seroquel addition.

## 2016-11-01 ENCOUNTER — Telehealth: Payer: Self-pay

## 2016-11-01 NOTE — Telephone Encounter (Signed)
Armando ReichertMarian pt's daughter left a VM she said during patient's last visit pt said that she didn't want to continue the seroquel bc "of the side effects" and she received a prescription for that today in the mail. Armando ReichertMarian wants to know if pt is to continue both the donepezil and seroquel?

## 2016-11-01 NOTE — Telephone Encounter (Signed)
Last visit we discussed trial tapering off seroquel to see if still needed (for agitation at night time).  Ok to take both seroquel and donepezil, but would leave it up to pt/family to decide if they want to continue seroquel or not.

## 2016-11-02 NOTE — Telephone Encounter (Signed)
Spoke to pts daughter Shirlee LimerickMarion who was very confused about the medications her mother is taking. She is unfamiliar with some of the names of the meds and will contact the office back when she is with her mother and has the medications in hand

## 2016-11-02 NOTE — Telephone Encounter (Signed)
Attempted to contact Armando ReichertMarian; vm not set up to leave message

## 2017-01-19 ENCOUNTER — Other Ambulatory Visit: Payer: Self-pay | Admitting: Family Medicine

## 2017-01-19 DIAGNOSIS — E785 Hyperlipidemia, unspecified: Secondary | ICD-10-CM

## 2017-01-19 DIAGNOSIS — E538 Deficiency of other specified B group vitamins: Secondary | ICD-10-CM

## 2017-01-20 ENCOUNTER — Emergency Department: Payer: Medicare Other

## 2017-01-20 ENCOUNTER — Ambulatory Visit (INDEPENDENT_AMBULATORY_CARE_PROVIDER_SITE_OTHER): Payer: Medicare Other

## 2017-01-20 ENCOUNTER — Emergency Department
Admission: EM | Admit: 2017-01-20 | Discharge: 2017-01-20 | Disposition: A | Discharge status: Home / Self Care | Payer: Medicare Other | Attending: Emergency Medicine | Admitting: Emergency Medicine

## 2017-01-20 ENCOUNTER — Telehealth: Payer: Self-pay

## 2017-01-20 DIAGNOSIS — E785 Hyperlipidemia, unspecified: Secondary | ICD-10-CM | POA: Diagnosis not present

## 2017-01-20 DIAGNOSIS — Z87891 Personal history of nicotine dependence: Secondary | ICD-10-CM | POA: Insufficient documentation

## 2017-01-20 DIAGNOSIS — Z23 Encounter for immunization: Secondary | ICD-10-CM | POA: Diagnosis not present

## 2017-01-20 DIAGNOSIS — R7989 Other specified abnormal findings of blood chemistry: Secondary | ICD-10-CM

## 2017-01-20 DIAGNOSIS — I10 Essential (primary) hypertension: Secondary | ICD-10-CM | POA: Diagnosis not present

## 2017-01-20 DIAGNOSIS — S61212D Laceration without foreign body of right middle finger without damage to nail, subsequent encounter: Secondary | ICD-10-CM | POA: Diagnosis not present

## 2017-01-20 DIAGNOSIS — Z79899 Other long term (current) drug therapy: Secondary | ICD-10-CM | POA: Insufficient documentation

## 2017-01-20 DIAGNOSIS — F0390 Unspecified dementia without behavioral disturbance: Secondary | ICD-10-CM | POA: Diagnosis not present

## 2017-01-20 DIAGNOSIS — W230XXD Caught, crushed, jammed, or pinched between moving objects, subsequent encounter: Secondary | ICD-10-CM | POA: Insufficient documentation

## 2017-01-20 LAB — LIPID PANEL
CHOLESTEROL: 214 mg/dL — AB (ref 0–200)
HDL: 66.1 mg/dL (ref >39.00)
LDL CALC: 131 mg/dL — AB (ref 0–99)
NonHDL: 148.02
TRIGLYCERIDES: 85 mg/dL (ref 0.0–149.0)
Total CHOL/HDL Ratio: 3
VLDL: 17 mg/dL (ref 0.0–40.0)

## 2017-01-20 LAB — BASIC METABOLIC PANEL
BUN: 25 mg/dL — AB (ref 6–23)
CO2: 26 mEq/L (ref 19–32)
Calcium: 10.1 mg/dL (ref 8.4–10.5)
Chloride: 100 mEq/L (ref 96–112)
Creatinine, Ser: 1.12 mg/dL (ref 0.40–1.20)
GFR: 58.75 mL/min — AB (ref >60.00)
GLUCOSE: 118 mg/dL — AB (ref 70–99)
POTASSIUM: 4.2 meq/L (ref 3.5–5.1)
Sodium: 137 mEq/L (ref 135–145)

## 2017-01-20 LAB — VITAMIN B12: Vitamin B-12: 895 pg/mL (ref 211–911)

## 2017-01-20 MED ORDER — LIDOCAINE HCL (PF) 1 % IJ SOLN
5.0000 mL | Freq: Once | INTRAMUSCULAR | Status: AC
  Administered 2017-01-20: 5 mL
  Filled 2017-01-20: qty 5

## 2017-01-20 MED ORDER — HYDROCODONE-ACETAMINOPHEN 5-325 MG PO TABS
1.0000 | ORAL_TABLET | Freq: Once | ORAL | Status: AC
  Administered 2017-01-20: 1 via ORAL
  Filled 2017-01-20: qty 1

## 2017-01-20 MED ORDER — TETANUS-DIPHTH-ACELL PERTUSSIS 5-2.5-18.5 LF-MCG/0.5 IM SUSP
0.5000 mL | Freq: Once | INTRAMUSCULAR | Status: AC
  Administered 2017-01-20: 0.5 mL via INTRAMUSCULAR
  Filled 2017-01-20: qty 0.5

## 2017-01-20 MED ORDER — LIDOCAINE HCL (PF) 1 % IJ SOLN
INTRAMUSCULAR | Status: DC
Start: 2017-01-20 — End: 2017-01-20
  Filled 2017-01-20: qty 5

## 2017-01-20 NOTE — Discharge Instructions (Signed)
Keep wound clean, dry and covered and dusty dirty wet environments.  Allow the clotting material in the base of the wound to fall off. Do not remove until it is ready to follow-up.  Continue to monitor for signs of improvement and healing. If you notice signs of developing infection do not hesitate to follow up with your primary care or return to the emergency department.  I recommend following up with your primary care returning to the emergency department for wound recheck in 2-3 days.  Change the dressing daily without removing the material in the base of the wound.

## 2017-01-20 NOTE — ED Provider Notes (Signed)
[facility] Emergency Department Provider Note   ____________________________________________   I have reviewed the triage vital signs and the nursing notes.   HISTORY  Chief Complaint Laceration and Finger Injury    HPI Erika Weiss is a 83 y.o. female presents emergency department with laceration along the tip of the right middle digit. Patient reports sustaining a laceration when she closed her finger in a door while at home last night. Patient reports sustaining a laceration between 9 and 10 PM. She states applying gauze and tape and not seeking any further care. Patient's daughters took her an MD appointment today for her annual physical. During assessment and obtaining labs, the nurse noted the injury and recommended she come for evaluation in the emergency department. The laceration continued hemorrhage however, the skin edge along the avulsed skin flap appeared dark, dry and no longer vascularized. The laceration was not appropriate for primary healing intention and/or suture closure. Advised the patient and her family that I would remove the nonviable skin and apply Surgicel to manage the continued bleeding followed by a dressing. Also advised they would need to follow up with the emergency department her primary care doctor over the next 2-3 days to assess for healing. Patient denies fever, chills, headache, vision changes, chest pain, chest tightness, shortness of breath, abdominal pain, nausea and vomiting.  Past Medical History:  Diagnosis Date  . Heart murmur new 2017?  Marland Kitchen History of colon cancer   . HLD (hyperlipidemia)   . Hyperglycemia   . Hypertension   . Osteoarthritis    knees  . Varicose veins of lower extremities with other complications     Patient Active Problem List   Diagnosis Date Noted  . Injury of left knee 08/23/2016  . Weight loss 07/10/2016  . Irritability and anger 10/21/2015  . Dementia 09/22/2015  . Systolic murmur  04/54/0981  . Hypertension   . HLD (hyperlipidemia)   . Hyperglycemia   . Osteoarthritis   . History of colon cancer 01/07/2013  . Varicose veins of lower extremities with other complications 01/07/2013    Past Surgical History:  Procedure Laterality Date  . ABDOMINAL HYSTERECTOMY  remote   fibroids  . COLON SURGERY  1999   colon cancer  . COLONOSCOPY  12/2008   TAx1 Evette Cristal)  . EYE SURGERY  2010  . INGUINAL HERNIA REPAIR  2001  . VEIN SURGERY Left 2008    Prior to Admission medications   Medication Sig Start Date End Date Taking? Authorizing Provider  amLODipine (NORVASC) 10 MG tablet Take 1 tablet (10 mg total) by mouth daily. 10/20/16   Eustaquio Boyden, MD  cyanocobalamin 500 MCG tablet Take 500 mcg by mouth daily.    [provider]  donepezil (ARICEPT) 10 MG tablet Take 1 tablet (10 mg total) by mouth at bedtime. 10/20/16   Eustaquio Boyden, MD  magnesium hydroxide (MILK OF MAGNESIA) 400 MG/5ML suspension Take by mouth as needed for mild constipation. 07/08/16   Eustaquio Boyden, MD  Melatonin 5 MG TABS Take 1 tablet (5 mg total) by mouth at bedtime. 09/06/16   Eustaquio Boyden, MD  memantine [facility]) 10 MG tablet TAKE 1 TABLET DAILY 09/26/16   Eustaquio Boyden, MD  metoprolol succinate (TOPROL-XL) 25 MG 24 hr tablet Take 3 tablets (75 mg total) by mouth daily. Take with or immediately following a meal. 08/10/16   Eustaquio Boyden, MD  naproxen sodium (ANAPROX) 220 MG tablet Take 220 mg by mouth as needed.  [provider]  QUEtiapine (SEROQUEL) 25 MG tablet Take 1 tablet (25 mg total) by mouth at bedtime. 10/20/16   Eustaquio Boyden, MD  sertraline (ZOLOFT) 25 MG tablet Take 1 tablet (25 mg total) by mouth daily. 10/20/16   Eustaquio Boyden, MD    Allergies Patient has no known allergies.  Family History  Problem Relation Age of Onset  . Cancer Father        colon  . Cancer Sister        breast  . Cancer Brother        pancreas  . Cancer  Brother        colon  . Diabetes Sister   . Diabetes Other        nephews  . Heart disease Sister   . Stroke Mother   . Hypertension Mother   . Hypertension Other        multiple  . CAD Neg Hx     Social History Social History  Substance Use Topics  . Smoking status: Former Smoker    Quit date: 04/25/1978  . Smokeless tobacco: Never Used  . Alcohol use No    Review of Systems Constitutional: Negative for fever/chills Eyes: No visual changes. Cardiovascular: Denies chest pain. Respiratory: Denies cough. Denies shortness of breath. Skin: Negative for rash. Laceration to the tip of the right middle digit without nailbed involvement. Neurological: Negative for headaches.  ____________________________________________   PHYSICAL EXAM:  VITAL SIGNS: ED Triage Vitals  Enc Vitals Group     BP --      Pulse Rate 01/20/17 1154 (!) 56     Resp 01/20/17 1154 16     Temp 01/20/17 1154 98.5 F (36.9 C)     Temp Source 01/20/17 1154 Oral     SpO2 01/20/17 1154 98 %     Weight 01/20/17 1155 127 lb (57.6 kg)     Height 01/20/17 1155  (1.702 m)     Head Circumference --      Peak Flow --      Pain Score 01/20/17 1154 0     Pain Loc --      Pain Edu? --      Excl. in GC? --     Constitutional: Alert and oriented. Well appearing and in no acute distress.  Eyes: Conjunctivae are normal. PERRL. EOMI  Head: Normocephalic and atraumatic. Cardiovascular: Normal rate, regular rhythm. Respiratory: Normal respiratory effort without tachypnea or retractions. Hematological/Lymphatic/Immunological: No cervical lymphadenopathy. Cardiovascular: Normal rate, regular rhythm. Normal distal pulses. Musculoskeletal: Intact range of motion and sensation of the right middle digit. No nail bed or tenderness involvement. Neurologic: Normal speech and language.  Skin:  Skin is warm, dry and intact. No rash noted. Approximate 2 cm avulsion laceration to the tip of the right middle digit without  nailbed involvement. Skin wound edges not approximated. The skin border of the avulsed flap appeared darken, dry and well vascularized. Wound bed beefy red with moderate hemorrhage control. Wound continues to ooze. No expose bone noted.  Psychiatric: Mood and affect are normal. Speech and behavior are normal. Patient exhibits appropriate insight and judgement.  ____________________________________________   LABS (all labs ordered are listed, but only abnormal results are displayed)  Labs Reviewed - No data to display ____________________________________________  EKG none ____________________________________________  RADIOLOGY DG finger middle right FINDINGS: Soft tissue laceration in the ventral, radial aspect of the distal middle finger. No fracture, dislocation or radiopaque foreign body. Degenerative changes involving the  third PIP and DIP joints.  IMPRESSION: No fracture or radiopaque foreign body. Degenerative changes. ____________________________________________   PROCEDURES  Procedure(s) performed:  LACERATION REPAIR Performed by: Clois Comber Authorized by: Clois Comber Consent: Verbal consent obtained. Risks and benefits: risks, benefits and alternatives were discussed Consent given by: patient Patient identity confirmed: provided demographic data Prepped and Draped in normal sterile fashion Wound explored  Laceration Location: Distal phalanx of the right middle digit  Laceration Length: 2.0 cm  No Foreign Bodies seen or palpated  Anesthesia: transthecal block and local infiltration  Local anesthetic: lidocaine 1%  Anesthetic total: 5.0 ml  Irrigation method: syringe Amount of cleaning: standard  Skin closure: Surgi-cel 4 layers  Required temporary tourniquet and pressure dressing to achieve full hemorrhage control.   Surgi-cel covered with 1-layer of xero-form with 2x2 and covered with 2 inch roll guaze.   Patient tolerance: Patient  tolerated the procedure well with no immediate complications.   Critical Care performed: no ____________________________________________   INITIAL IMPRESSION / ASSESSMENT AND PLAN / ED COURSE  Pertinent labs & imaging results that were available during my care of the patient were reviewed by me and considered in my medical decision making (see chart for details).   Patient sustained a laceration right middle digit, distal aspect without nail, bone or tendonous involvement.  Assessment confirmed movement and sensation of the digit before and after wound closure. Laceration required surgicel closure as noted above. Patient tolerated procedure well. Pt instructed to keep wound clean and dry and will return to the emergency department or PCP for wound recheck in 2-3 days. Patient also instructed to watch for signs of infection and return if changes are noted. Patient and family instructed on dressing changes. Patient informed of clinical course, understand medical decision-making process, and agree with plan. .    ___________________________________   FINAL CLINICAL IMPRESSION(S) / ED DIAGNOSES  Final diagnoses:  Laceration of right middle finger without foreign body without damage to nail, subsequent encounter       NEW MEDICATIONS STARTED DURING THIS VISIT:  Discharge Medication List as of 01/20/2017  2:04 PM       Note:  This document was prepared using Dragon voice recognition software and may include unintentional dictation errors.    Clois Comber, PA-C 01/20/17 1646    Dionne Bucy, MD 01/21/17 [phone]

## 2017-01-20 NOTE — Telephone Encounter (Signed)
Patient was in today for an AWV. During encounter, writer noticed patient was holding a bloody tissue around her right middle finger. Writer removed bandage and noted deep laceration to tip of finger. PCP was immediately notified.   Due to unavailability of an open appt slot with PCP or any providers in office, patient was encouraged to go to urgent care facility or nearest ER.   Made multiple attempts to reach patient on her and her daughters' home and mobile phones. Spoke with daughter Tyler Aas who shared the treatment Ms. Crunkleton received for her injury. PCP was notified.   Per PCP, patient was scheduled a follow-up appt on 01/24/17 @ 1015 for wound management.  PCP advised.

## 2017-01-20 NOTE — ED Triage Notes (Signed)
Shut right hand middle finger in door last night. Laceration to tip of digit. Able to move finger.

## 2017-01-20 NOTE — Progress Notes (Signed)
AWV not completed.Per PCP, pt needs to go to urgent care due to injury to finger. Labs were completed.

## 2017-01-24 ENCOUNTER — Encounter: Payer: Self-pay | Admitting: Family Medicine

## 2017-01-24 ENCOUNTER — Ambulatory Visit (INDEPENDENT_AMBULATORY_CARE_PROVIDER_SITE_OTHER): Payer: Medicare Other | Admitting: Family Medicine

## 2017-01-24 VITALS — BP 122/70 | HR 56 | Temp 97.6°F | Wt 127.0 lb

## 2017-01-24 DIAGNOSIS — F0391 Unspecified dementia with behavioral disturbance: Secondary | ICD-10-CM | POA: Diagnosis not present

## 2017-01-24 DIAGNOSIS — S6991XD Unspecified injury of right wrist, hand and finger(s), subsequent encounter: Secondary | ICD-10-CM | POA: Diagnosis not present

## 2017-01-24 DIAGNOSIS — S61209D Unspecified open wound of unspecified finger without damage to nail, subsequent encounter: Secondary | ICD-10-CM

## 2017-01-24 DIAGNOSIS — Z23 Encounter for immunization: Secondary | ICD-10-CM

## 2017-01-24 HISTORY — DX: Unspecified open wound of unspecified finger without damage to nail, subsequent encounter: S61.209D

## 2017-01-24 MED ORDER — CEPHALEXIN 500 MG PO CAPS: 500.0000 mg | ORAL_CAPSULE | Freq: Three times a day (TID) | ORAL | 0 refills | Status: DC

## 2017-01-24 NOTE — Assessment & Plan Note (Signed)
Continue namenda and aricept 

## 2017-01-24 NOTE — Assessment & Plan Note (Signed)
Fortunately no bone involvement.

## 2017-01-24 NOTE — Assessment & Plan Note (Addendum)
Start empiric keflex  tid x5 days.  Dressing had fully fallen off, pt has not been keeping wound clean or dry - dementia complicates picture.  Wound irrigated with sterile water, dressed with triple abx ointment and non stick gauze and tube gauze.  I will see patient daily until she establishes with wound clinic.  Given size of wound, I'd feel more comfortable with wound care clinic involvement. Will try to expedite referral.

## 2017-01-24 NOTE — Progress Notes (Addendum)
BP 122/70 (BP Location: Right Arm, Patient Position: Sitting, Cuff Size: Normal)   Pulse (!) 56   Temp 97.6 F (36.4 C) (Oral)   Wt 127 lb (57.6 kg)   SpO2 98%   BMI 19.89 kg/m    CC: wound check Subjective:    Patient ID: Erika Weiss, female    DOB: [birth year], 83 y.o.   MRN: [id number]  HPI: Erika Weiss is a 83 y.o. female presenting on 01/24/2017 for Wound Check (Injury to middle right finger. Says bandages came off yesterday and it looked good to her.)   Seen here last week for medicare wellness visit with our health advisor, this was postponed due to crush finger laceration/injury sustained the night before - crushed R middle finger on bathroom door. Sent directly to ER, records reviewed. Unable to suture laceration, instead nonviable skin was removed and 4 layers of surgicel were placed on finger. States bandage fully fell off yesterday and "finger looked good" to her, wrapped with multiple bandaids.   ER xray didn't show fracture.   Here for f/u.   Care complicated by dementia. She lives with her husband, both suffer from some dementia. Daughters nearby and involved in care.   Relevant past medical, surgical, family and social history reviewed and updated as indicated. Interim medical history since our last visit reviewed. Allergies and medications reviewed and updated. Outpatient Medications Prior to Visit  Medication Sig Dispense Refill  . amLODipine (NORVASC) 10 MG tablet Take 1 tablet (10 mg total) by mouth daily. 90 tablet 1  . cyanocobalamin 500 MCG tablet Take 500 mcg by mouth daily.    Marland Kitchen donepezil (ARICEPT) 10 MG tablet Take 1 tablet (10 mg total) by mouth at bedtime. 90 tablet 1  . magnesium hydroxide (MILK OF MAGNESIA) 400 MG/5ML suspension Take by mouth as needed for mild constipation.    . Melatonin 5 MG TABS Take 1 tablet (5 mg total) by mouth at bedtime.  0  . memantine (NAMENDA) 10 MG tablet TAKE 1 TABLET DAILY 90 tablet 2  . metoprolol succinate  (TOPROL-XL) 25 MG 24 hr tablet Take 3 tablets (75 mg total) by mouth daily. Take with or immediately following a meal. 270 tablet 2  . naproxen sodium (ANAPROX) 220 MG tablet Take 220 mg by mouth as needed.    . sertraline (ZOLOFT) 25 MG tablet Take 1 tablet (25 mg total) by mouth daily. 90 tablet 1  . QUEtiapine (SEROQUEL) 25 MG tablet Take 1 tablet (25 mg total) by mouth at bedtime. 90 tablet 1   No facility-administered medications prior to visit.     Past Medical History:  Diagnosis Date  . Heart murmur new 2017?  Marland Kitchen History of colon cancer   . HLD (hyperlipidemia)   . Hyperglycemia   . Hypertension   . Osteoarthritis    knees  . Varicose veins of lower extremities with other complications     Past Surgical History:  Procedure Laterality Date  . ABDOMINAL HYSTERECTOMY  remote   fibroids  . COLON SURGERY  1999   colon cancer  . COLONOSCOPY  12/2008   TAx1 Evette Cristal)  . EYE SURGERY  2010  . INGUINAL HERNIA REPAIR  2001  . VEIN SURGERY Left 2008    Per HPI unless specifically indicated in ROS section below Review of Systems     Objective:    BP 122/70 (BP Location: Right Arm, Patient Position: Sitting, Cuff Size: Normal)   Pulse (!) 56  Temp 97.6 F (36.4 C) (Oral)   Wt 127 lb (57.6 kg)   SpO2 98%   BMI 19.89 kg/m   Wt Readings from Last 3 Encounters:  01/24/17 127 lb (57.6 kg)  01/20/17 127 lb (57.6 kg)  10/20/16 131 lb 4 oz (59.5 kg)    Physical Exam  Constitutional: She appears well-developed and well-nourished. No distress.  Musculoskeletal:       Hands: Skin: Skin is warm and dry.  R middle finger tip of digit with large wound of denuded skin, edges macerated and not well approximated. No tendons visualized. No spreading erythema.  See photo.  Nursing note and vitals reviewed.  I took a photo of wound to scan into epic, but apparently haiku/epic network was down so it did not save/transfer.  Results for orders placed or performed in visit on 01/20/17    Lipid panel  Result Value Ref Range   Cholesterol 214 (H) 0 - 200 mg/dL   Triglycerides 09.8 0.0 - 149.0 mg/dL   HDL 11.91 >47.82 mg/dL   VLDL 95.6 0.0 - 21.3 mg/dL   LDL Cholesterol 086 (H) 0 - 99 mg/dL   Total CHOL/HDL Ratio 3    NonHDL 148.02   Basic metabolic panel  Result Value Ref Range   Sodium 137 135 - 145 mEq/L   Potassium 4.2 3.5 - 5.1 mEq/L   Chloride 100 96 - 112 mEq/L   CO2 26 19 - 32 mEq/L   Glucose, Bld 118 (H) 70 - 99 mg/dL   BUN 25 (H) 6 - 23 mg/dL   Creatinine, Ser 5.78 0.40 - 1.20 mg/dL   Calcium 46.9 8.4 - 62.9 mg/dL   GFR 52.84 (L) >13.24 mL/min  Vitamin B12  Result Value Ref Range   Vitamin B-12 895 211 - 911 pg/mL      Assessment & Plan:   Problem List Items Addressed This Visit    Dementia    Continue namenda and aricept.       Finger injury, right, subsequent encounter    Fortunately no bone involvement.      Relevant Orders   Ambulatory referral to Wound Clinic   Finger wound, simple, open, subsequent encounter - Primary    Start empiric keflex  tid x5 days.  Dressing had fully fallen off, pt has not been keeping wound clean or dry - dementia complicates picture.  Wound irrigated with sterile water, dressed with triple abx ointment and non stick gauze and tube gauze.  I will see patient daily until she establishes with wound clinic.  Given size of wound, I'd feel more comfortable with wound care clinic involvement. Will try to expedite referral.       Relevant Orders   Ambulatory referral to Wound Clinic    Other Visit Diagnoses    Need for influenza vaccination       Relevant Orders   Flu Vaccine QUAD 6+ mos PF IM (Fluarix Quad PF) (Completed)       Follow up plan: Return in about 2 days (around 01/26/2017).  Eustaquio Boyden, MD

## 2017-01-24 NOTE — Patient Instructions (Addendum)
Flu shot today. Wound dressed today with antibiotic ointment and non stick gauze We will refer you to wound clinic. See [name] on your way out.  Come in daily for dressing change.  Start keflex antibiotic.

## 2017-01-24 NOTE — Addendum Note (Signed)
Addended by: Nanci Pina on: 01/24/2017 11:17 AM   Modules accepted: Orders

## 2017-01-25 ENCOUNTER — Encounter: Payer: Self-pay | Admitting: Family Medicine

## 2017-01-25 ENCOUNTER — Ambulatory Visit (INDEPENDENT_AMBULATORY_CARE_PROVIDER_SITE_OTHER): Payer: Medicare Other | Admitting: Family Medicine

## 2017-01-25 VITALS — BP 124/68 | HR 61 | Temp 98.0°F | Wt 127.0 lb

## 2017-01-25 DIAGNOSIS — S61209D Unspecified open wound of unspecified finger without damage to nail, subsequent encounter: Secondary | ICD-10-CM

## 2017-01-25 NOTE — Assessment & Plan Note (Signed)
Continue keflex. Wound irrigated with sterile water. Dressing changed today with triple abx ointment, non stick gauze and tube gauze to finger, then coban wrap.  RTC tomorrow for wound check/dressing change.

## 2017-01-25 NOTE — Patient Instructions (Addendum)
Dressings changed today. Wound looking ok.  Return tomorrow for wound check.  Keep dressings clean and dry.

## 2017-01-25 NOTE — Progress Notes (Signed)
BP 124/68 (BP Location: Left Arm, Patient Position: Sitting, Cuff Size: Normal)   Pulse 61   Temp 98 F (36.7 C) (Oral)   Wt 127 lb (57.6 kg)   SpO2 98%   BMI 19.89 kg/m    CC: wound check Subjective:    Patient ID: Erika Weiss, female    DOB: [birth year], 83 y.o.   MRN: [id number]  HPI: Erika Weiss is a 83 y.o. female presenting on 01/25/2017 for Dressing Change (for right middle finger)   Here with daughter. See prior note for details. We have scheduled appt on Wed for wound clinic at [facility]. Will see here QD to QOD in interim for wound check. Dementia complicates care of wound at home.  Daughters very involved in care.  Tolerating keflex well.  She has kept dressing clean and dry.   Relevant past medical, surgical, family and social history reviewed and updated as indicated. Interim medical history since our last visit reviewed. Allergies and medications reviewed and updated. Outpatient Medications Prior to Visit  Medication Sig Dispense Refill  . amLODipine (NORVASC) 10 MG tablet Take 1 tablet (10 mg total) by mouth daily. 90 tablet 1  . cephALEXin (KEFLEX) 500 MG capsule Take 1 capsule (500 mg total) by mouth 3 (three) times daily. 15 capsule 0  . cyanocobalamin 500 MCG tablet Take 500 mcg by mouth daily.    Marland Kitchen donepezil (ARICEPT) 10 MG tablet Take 1 tablet (10 mg total) by mouth at bedtime. 90 tablet 1  . magnesium hydroxide (MILK OF MAGNESIA) 400 MG/5ML suspension Take by mouth as needed for mild constipation.    . Melatonin 5 MG TABS Take 1 tablet (5 mg total) by mouth at bedtime.  0  . memantine (NAMENDA) 10 MG tablet TAKE 1 TABLET DAILY 90 tablet 2  . metoprolol succinate (TOPROL-XL) 25 MG 24 hr tablet Take 3 tablets (75 mg total) by mouth daily. Take with or immediately following a meal. 270 tablet 2  . naproxen sodium (ANAPROX) 220 MG tablet Take 220 mg by mouth as needed.    . sertraline (ZOLOFT) 25 MG tablet Take 1 tablet (25 mg total) by mouth  daily. 90 tablet 1   No facility-administered medications prior to visit.      Per HPI unless specifically indicated in ROS section below Review of Systems     Objective:    BP 124/68 (BP Location: Left Arm, Patient Position: Sitting, Cuff Size: Normal)   Pulse 61   Temp 98 F (36.7 C) (Oral)   Wt 127 lb (57.6 kg)   SpO2 98%   BMI 19.89 kg/m   Wt Readings from Last 3 Encounters:  01/25/17 127 lb (57.6 kg)  01/24/17 127 lb (57.6 kg)  01/20/17 127 lb (57.6 kg)    Physical Exam  Constitutional: She appears well-developed and well-nourished. No distress.  Skin:  See pic Large area of denuded skin at pulp of R 3rd digit  Persistent maceration around edges of wound, decreased edema compared to yesterday, no erythema. Viable granulation tissue throughout wound  Nursing note and vitals reviewed.        Assessment & Plan:   Problem List Items Addressed This Visit    Finger wound, simple, open, subsequent encounter - Primary    Continue keflex. Wound irrigated with sterile water. Dressing changed today with triple abx ointment, non stick gauze and tube gauze to finger, then coban wrap.  RTC tomorrow for wound check/dressing change.  Follow up plan: No Follow-up on file.  Danelly Hassinger, MD   

## 2017-01-26 ENCOUNTER — Ambulatory Visit (INDEPENDENT_AMBULATORY_CARE_PROVIDER_SITE_OTHER): Payer: Medicare Other | Admitting: Family Medicine

## 2017-01-26 ENCOUNTER — Encounter: Payer: Self-pay | Admitting: Family Medicine

## 2017-01-26 VITALS — BP 132/72 | HR 56 | Temp 97.8°F | Wt 129.0 lb

## 2017-01-26 DIAGNOSIS — S61209D Unspecified open wound of unspecified finger without damage to nail, subsequent encounter: Secondary | ICD-10-CM | POA: Diagnosis not present

## 2017-01-26 NOTE — Assessment & Plan Note (Signed)
Wound irrigated with sterile water and dressed with triple abx ointment, non stick gauze, and tube gauze. She has f/u scheduled tomorrow.

## 2017-01-26 NOTE — Progress Notes (Signed)
   BP 132/72 (BP Location: Left Arm, Patient Position: Sitting, Cuff Size: Normal)   Pulse (!) 56   Temp 97.8 F (36.6 C) (Oral)   Wt 129 lb (58.5 kg)   SpO2 96%   BMI 20.20 kg/m    CC: wound check Subjective:    Patient ID: Erika Weiss, female    DOB: 1925-04-11, 83 y.o.   MRN: [id number]  HPI: Erika Weiss is a 83 y.o. female presenting on 01/26/2017 for Dressing Change   Here with daughter [name] today. See prior note for details.  Dementia complicates wound care.  She has kept dressing clean and dry   Relevant past medical, surgical, family and social history reviewed and updated as indicated. Interim medical history since our last visit reviewed. Allergies and medications reviewed and updated. Outpatient Medications Prior to Visit  Medication Sig Dispense Refill  . amLODipine (NORVASC) 10 MG tablet Take 1 tablet (10 mg total) by mouth daily. 90 tablet 1  . cephALEXin (KEFLEX) 500 MG capsule Take 1 capsule (500 mg total) by mouth 3 (three) times daily. 15 capsule 0  . cyanocobalamin 500 MCG tablet Take 500 mcg by mouth daily.    Marland Kitchen donepezil (ARICEPT) 10 MG tablet Take 1 tablet (10 mg total) by mouth at bedtime. 90 tablet 1  . magnesium hydroxide (MILK OF MAGNESIA) 400 MG/5ML suspension Take by mouth as needed for mild constipation.    . Melatonin 5 MG TABS Take 1 tablet (5 mg total) by mouth at bedtime.  0  . memantine (NAMENDA) 10 MG tablet TAKE 1 TABLET DAILY 90 tablet 2  . metoprolol succinate (TOPROL-XL) 25 MG 24 hr tablet Take 3 tablets (75 mg total) by mouth daily. Take with or immediately following a meal. 270 tablet 2  . naproxen sodium (ANAPROX) 220 MG tablet Take 220 mg by mouth as needed.    . sertraline (ZOLOFT) 25 MG tablet Take 1 tablet (25 mg total) by mouth daily. 90 tablet 1   No facility-administered medications prior to visit.      Per HPI unless specifically indicated in ROS section below Review of Systems     Objective:    BP 132/72 (BP  Location: Left Arm, Patient Position: Sitting, Cuff Size: Normal)   Pulse (!) 56   Temp 97.8 F (36.6 C) (Oral)   Wt 129 lb (58.5 kg)   SpO2 96%   BMI 20.20 kg/m   Wt Readings from Last 3 Encounters:  01/26/17 129 lb (58.5 kg)  01/25/17 127 lb (57.6 kg)  01/24/17 127 lb (57.6 kg)    Physical Exam  Skin:  Large area of denuded skin at pulp of R 3rd digit  Persistent maceration around edges of wound, decreased swelling, no erythema. Viable granulation tissue throughout wound   Nursing note and vitals reviewed.     Assessment & Plan:   Problem List Items Addressed This Visit    Finger wound, simple, open, subsequent encounter - Primary    Wound irrigated with sterile water and dressed with triple abx ointment, non stick gauze, and tube gauze. She has f/u scheduled tomorrow.           Follow up plan: No Follow-up on file.  Eustaquio Boyden, MD

## 2017-01-27 ENCOUNTER — Ambulatory Visit (INDEPENDENT_AMBULATORY_CARE_PROVIDER_SITE_OTHER): Payer: Medicare Other | Admitting: Family Medicine

## 2017-01-27 ENCOUNTER — Ambulatory Visit: Payer: Medicare Other | Admitting: Family Medicine

## 2017-01-27 ENCOUNTER — Encounter: Payer: Self-pay | Admitting: Family Medicine

## 2017-01-27 VITALS — BP 124/80 | HR 55 | Temp 98.3°F | Ht 63.0 in | Wt 124.2 lb

## 2017-01-27 DIAGNOSIS — R454 Irritability and anger: Secondary | ICD-10-CM

## 2017-01-27 DIAGNOSIS — S61209D Unspecified open wound of unspecified finger without damage to nail, subsequent encounter: Secondary | ICD-10-CM | POA: Diagnosis not present

## 2017-01-27 DIAGNOSIS — R011 Cardiac murmur, unspecified: Secondary | ICD-10-CM

## 2017-01-27 DIAGNOSIS — F0391 Unspecified dementia with behavioral disturbance: Secondary | ICD-10-CM | POA: Diagnosis not present

## 2017-01-27 DIAGNOSIS — I10 Essential (primary) hypertension: Secondary | ICD-10-CM

## 2017-01-27 DIAGNOSIS — R739 Hyperglycemia, unspecified: Secondary | ICD-10-CM | POA: Diagnosis not present

## 2017-01-27 DIAGNOSIS — E785 Hyperlipidemia, unspecified: Secondary | ICD-10-CM

## 2017-01-27 DIAGNOSIS — Z0000 Encounter for general adult medical examination without abnormal findings: Secondary | ICD-10-CM

## 2017-01-27 DIAGNOSIS — Z7189 Other specified counseling: Secondary | ICD-10-CM | POA: Insufficient documentation

## 2017-01-27 NOTE — Assessment & Plan Note (Addendum)
Wound irrigated with sterile water and dressed with triple abx. Home care reviewed. I asked them to change dressing in 2 days (Sunday). RTC Monday for f/u.

## 2017-01-27 NOTE — Assessment & Plan Note (Signed)

## 2017-01-27 NOTE — Patient Instructions (Addendum)
Good to see you today. Continue current medicines. Change dressing on Sunday - material provided today.  Return Monday for wound check.

## 2017-01-27 NOTE — Assessment & Plan Note (Signed)
Mild. Continue to monitor. 

## 2017-01-27 NOTE — Assessment & Plan Note (Addendum)
Chronic, improved on higher toprol XL. Will monitor bradycardia.

## 2017-01-27 NOTE — Assessment & Plan Note (Signed)
Chronic, stable 

## 2017-01-27 NOTE — Assessment & Plan Note (Signed)
Advanced planning - has at home - will bring [place] a copy. Children are HCPOA.

## 2017-01-27 NOTE — Assessment & Plan Note (Signed)
Chronic, stable off statin. Continue to monitor.

## 2017-01-27 NOTE — Progress Notes (Signed)
BP 124/80 (BP Location: Left Arm, Patient Position: Sitting, Cuff Size: Normal)   Pulse (!) 55   Temp 98.3 F (36.8 C) (Oral)   Ht  (1.6 m)   Wt 124 lb 4 oz (56.4 kg)   SpO2 97%   BMI 22.01 kg/m    CC: medicare wellness visit Subjective:    Patient ID: Erika Weiss, female    DOB: 1924/08/22, 83 y.o.   MRN: [id number]  HPI: Erika Weiss is a 83 y.o. female presenting on 01/27/2017 for Medicare Wellness and Dressing Change   See prior note for details. Being seen here regularly for finger wound dressing changes until she can be seen next Wednesday at wound clinic. We have been washing with sterile water daily and dressing with triple abx ointment. Done again today.   Known dementia. Compliant with namenda  daily, aricept  daily, sertraline  daily. Never tried seroquel. She takes melatonin.   Occasional constipation treated with MOM.  No urinary incontinence.   Geriatric Assessment: Activities of Daily Living:     Bathing- indepedent    Dressing- indepedent    Eating- indepedent    Toileting- indepedent    Transferring- independent     Continence- indepedent Overall Assessment: indepedent  Instrumental Activities of Daily Living:     Transportation- Dependent    Meal/Food Preparation- independent     Shopping Errands- Dependent    Housekeeping/Chores- independent    Money Management/Finances- Dependent    Medication Management- Dependent    Ability to Use Telephone- independent    Laundry- independent Overall Assessment: partly dependent    Hearing Screening             Right ear:    40 25 20  40    Left ear:   25 40 20  25      Visual Acuity Screening   Right eye Left eye Both eyes  Without correction:     With correction:   Preventative: Preventative healthcare - largely aged out. H/o colon cancer, last colonoscopy 2010.  Flu shot yearly Tdap  12/2016 Pneumococcal vaccines - could be interested in the future Advanced planning - has at home - will bring [place] a copy. Children are HCPOA.  Seat belt use discussed.  Sunscreen use discussed. No changing moles on skin. Non smoker Alcohol - none  Lives with husband Both suffer from some dementia Occ: retired, worked in Education officer, environmental at [employer]: enjoys walking  Diet: good water, fruits/vegetables daily  Relevant past medical, surgical, family and social history reviewed and updated as indicated. Interim medical history since our last visit reviewed. Allergies and medications reviewed and updated. Outpatient Medications Prior to Visit  Medication Sig Dispense Refill  . amLODipine (NORVASC) 10 MG tablet Take 1 tablet (10 mg total) by mouth daily. 90 tablet 1  . cephALEXin (KEFLEX) 500 MG capsule Take 1 capsule (500 mg total) by mouth 3 (three) times daily. 15 capsule 0  . cyanocobalamin 500 MCG tablet Take 500 mcg by mouth daily.    Marland Kitchen donepezil (ARICEPT) 10 MG tablet Take 1 tablet (10 mg total) by mouth at bedtime. 90 tablet 1  . magnesium hydroxide (MILK OF MAGNESIA) 400 MG/5ML suspension Take by mouth as needed for mild constipation.    . Melatonin 5 MG TABS Take 1 tablet (5 mg total) by mouth at bedtime.  0  . memantine (NAMENDA) 10 MG tablet TAKE 1 TABLET DAILY 90 tablet 2  .  metoprolol succinate (TOPROL-XL) 25 MG 24 hr tablet Take 3 tablets (75 mg total) by mouth daily. Take with or immediately following a meal. 270 tablet 2  . naproxen sodium (ANAPROX) 220 MG tablet Take 220 mg by mouth as needed.    . sertraline (ZOLOFT) 25 MG tablet Take 1 tablet (25 mg total) by mouth daily. 90 tablet 1   No facility-administered medications prior to visit.      Per HPI unless specifically indicated in ROS section below Review of Systems     Objective:    BP 124/80 (BP Location: Left Arm, Patient Position: Sitting, Cuff Size: Normal)   Pulse (!) 55   Temp 98.3 F (36.8  C) (Oral)   Ht  (1.6 m)   Wt 124 lb 4 oz (56.4 kg)   SpO2 97%   BMI 22.01 kg/m   Wt Readings from Last 3 Encounters:  01/27/17 124 lb 4 oz (56.4 kg)  01/26/17 129 lb (58.5 kg)  01/25/17 127 lb (57.6 kg)    Physical Exam  Constitutional: She is oriented to person, place, and time. She appears well-developed and well-nourished. No distress.  HENT:  Head: Normocephalic and atraumatic.  Right Ear: Hearing, tympanic membrane, external ear and ear canal normal.  Left Ear: Hearing, tympanic membrane, external ear and ear canal normal.  Nose: Nose normal.  Mouth/Throat: Uvula is midline, oropharynx is clear and moist and mucous membranes are normal. No oropharyngeal exudate, posterior oropharyngeal edema or posterior oropharyngeal erythema.  Eyes: Pupils are equal, round, and reactive to light. Conjunctivae and EOM are normal. No scleral icterus.  Neck: Normal range of motion. Neck supple.  Cardiovascular: Normal rate, regular rhythm and intact distal pulses.   Murmur (2/6 SEM) heard. Pulses:      Radial pulses are 2+ on the right side, and 2+ on the left side.  Pulmonary/Chest: Effort normal and breath sounds normal. No respiratory distress. She has no wheezes. She has no rales.  Abdominal: Soft. Bowel sounds are normal. She exhibits no distension and no mass. There is no tenderness. There is no rebound and no guarding.  Musculoskeletal: Normal range of motion. She exhibits no edema.  Lymphadenopathy:    She has no cervical adenopathy.  Neurological: She is alert and oriented to person, place, and time.  CN grossly intact, station and gait intact  Skin: Skin is warm and dry. No rash noted.  Area of denuded skin at pulp of R 3rd digit  Decreased swelling, no erythema. Viable granulation tissue throughout wound   Psychiatric: She has a normal mood and affect. Her behavior is normal. Judgment and thought content normal.  Nursing note and vitals reviewed.     Results for orders  placed or performed in visit on 01/20/17  Lipid panel  Result Value Ref Range   Cholesterol 214 (H) 0 - 200 mg/dL   Triglycerides 16.1 0.0 - 149.0 mg/dL   HDL 09.60 >45.40 mg/dL   VLDL 98.1 0.0 - 19.1 mg/dL   LDL Cholesterol 478 (H) 0 - 99 mg/dL   Total CHOL/HDL Ratio 3    NonHDL 148.02   Basic metabolic panel  Result Value Ref Range   Sodium 137 135 - 145 mEq/L   Potassium 4.2 3.5 - 5.1 mEq/L   Chloride 100 96 - 112 mEq/L   CO2 26 19 - 32 mEq/L   Glucose, Bld 118 (H) 70 - 99 mg/dL   BUN 25 (H) 6 - 23 mg/dL   Creatinine, Ser 2.95  0.40 - 1.20 mg/dL   Calcium 11.9 8.4 - 14.7 mg/dL   GFR 82.95 (L) >62.13 mL/min  Vitamin B12  Result Value Ref Range   Vitamin B-12 895 211 - 911 pg/mL      Assessment & Plan:   Problem List Items Addressed This Visit    Advanced care planning/counseling discussion    Advanced planning - has at home - will bring [place] a copy. Children are HCPOA.       Dementia    Chronic, stable on namenda and aricept.       Finger wound, simple, open, subsequent encounter    Wound irrigated with sterile water and dressed with triple abx. Home care reviewed. I asked them to change dressing in 2 days (Sunday). RTC Monday for f/u.       HLD (hyperlipidemia)    Chronic, stable off statin. Continue to monitor.       Hyperglycemia    Mild. Continue to monitor.       Hypertension    Chronic, improved on higher toprol XL. Will monitor bradycardia.       Irritability and anger    Stable period on sertraline. Continue.       Medicare annual wellness visit, subsequent - Primary    I have personally reviewed the Medicare Annual Wellness questionnaire and have noted 1. The patient's medical and social history 2. Their use of alcohol, tobacco or illicit drugs 3. Their current medications and supplements 4. The patient's functional ability including ADL's, fall risks, home safety risks and hearing or visual impairment. Cognitive function has been assessed and  addressed as indicated.  5. Diet and physical activity 6. Evidence for depression or mood disorders The patients weight, height, BMI have been recorded in the chart. I have made referrals, counseling and provided education to the patient based on review of the above and I have provided the pt with a written personalized care plan for preventive services. Provider list updated.. See scanned questionairre as needed for further documentation. Reviewed preventative protocols and updated unless pt declined.       Systolic murmur    Chronic, stable.           Follow up plan: No Follow-up on file.  Eustaquio Boyden, MD

## 2017-01-27 NOTE — Assessment & Plan Note (Signed)
Chronic, stable on namenda and aricept.

## 2017-01-27 NOTE — Assessment & Plan Note (Signed)
Stable period on sertraline. Continue.  

## 2017-01-30 ENCOUNTER — Ambulatory Visit (INDEPENDENT_AMBULATORY_CARE_PROVIDER_SITE_OTHER): Payer: Medicare Other | Admitting: Family Medicine

## 2017-01-30 ENCOUNTER — Encounter: Payer: Self-pay | Admitting: Family Medicine

## 2017-01-30 VITALS — BP 124/64 | HR 48 | Temp 98.0°F | Wt 127.8 lb

## 2017-01-30 DIAGNOSIS — S61209D Unspecified open wound of unspecified finger without damage to nail, subsequent encounter: Secondary | ICD-10-CM

## 2017-01-30 DIAGNOSIS — I10 Essential (primary) hypertension: Secondary | ICD-10-CM

## 2017-01-30 MED ORDER — METOPROLOL SUCCINATE ER 25 MG PO TB24: 50.0000 mg | ORAL_TABLET | Freq: Every day | ORAL | 1 refills | Status: DC

## 2017-01-30 NOTE — Assessment & Plan Note (Signed)
Decrease toprol XL to  daily due to ongoing bradycardia.

## 2017-01-30 NOTE — Assessment & Plan Note (Addendum)
Improvement noted. Wound irrigated with sterile water and dressed with triple abx. Home care reviewed. She has appt on Wednesday with [facility]. Discussed ok to come here for dressing changed depending on treatment regimen by wound clinic.

## 2017-01-30 NOTE — Progress Notes (Signed)
BP 124/64 (BP Location: Left Arm, Patient Position: Sitting, Cuff Size: Normal)   Pulse (!) 48   Temp 98 F (36.7 C) (Oral)   Wt 127 lb 12 oz (57.9 kg)   SpO2 98%   BMI 22.63 kg/m    CC: wound check Subjective:    Patient ID: Erika Weiss, female    DOB: [birth year], 83 y.o.   MRN: [id number]  HPI: Erika Weiss is a 83 y.o. female presenting on 01/30/2017 for Dressing Change   She has completed 5d keflex antibiotics.  Did well with dressing change yesterday at home.  Remains bradycardic and endorses some fatigue.   Relevant past medical, surgical, family and social history reviewed and updated as indicated. Interim medical history since our last visit reviewed. Allergies and medications reviewed and updated. Outpatient Medications Prior to Visit  Medication Sig Dispense Refill  . amLODipine (NORVASC) 10 MG tablet Take 1 tablet (10 mg total) by mouth daily. 90 tablet 1  . cephALEXin (KEFLEX) 500 MG capsule Take 1 capsule (500 mg total) by mouth 3 (three) times daily. 15 capsule 0  . cyanocobalamin 500 MCG tablet Take 500 mcg by mouth daily.    Marland Kitchen donepezil (ARICEPT) 10 MG tablet Take 1 tablet (10 mg total) by mouth at bedtime. 90 tablet 1  . magnesium hydroxide (MILK OF MAGNESIA) 400 MG/5ML suspension Take by mouth as needed for mild constipation.    . Melatonin 5 MG TABS Take 1 tablet (5 mg total) by mouth at bedtime.  0  . memantine (NAMENDA) 10 MG tablet TAKE 1 TABLET DAILY 90 tablet 2  . naproxen sodium (ANAPROX) 220 MG tablet Take 220 mg by mouth as needed.    . sertraline (ZOLOFT) 25 MG tablet Take 1 tablet (25 mg total) by mouth daily. 90 tablet 1  . metoprolol succinate (TOPROL-XL) 25 MG 24 hr tablet Take 3 tablets (75 mg total) by mouth daily. Take with or immediately following a meal. 270 tablet 2   No facility-administered medications prior to visit.      Per HPI unless specifically indicated in ROS section below Review of Systems     Objective:    BP  124/64 (BP Location: Left Arm, Patient Position: Sitting, Cuff Size: Normal)   Pulse (!) 48   Temp 98 F (36.7 C) (Oral)   Wt 127 lb 12 oz (57.9 kg)   SpO2 98%   BMI 22.63 kg/m   Wt Readings from Last 3 Encounters:  01/30/17 127 lb 12 oz (57.9 kg)  01/27/17 124 lb 4 oz (56.4 kg)  01/26/17 129 lb (58.5 kg)    Physical Exam  Constitutional: She appears well-developed and well-nourished. No distress.  Skin:  Area of denuded skin at pulp of R 3rd digit  Decreased swelling, no erythema. Viable granulation tissue throughout wound    Nursing note and vitals reviewed.        Assessment & Plan:   Problem List Items Addressed This Visit    Finger wound, simple, open, subsequent encounter - Primary    Improvement noted. Wound irrigated with sterile water and dressed with triple abx. Home care reviewed. She has appt on Wednesday with [facility]. Discussed ok to come here for dressing changed depending on treatment regimen by wound clinic.       Hypertension    Decrease toprol XL to  daily due to ongoing bradycardia.       Relevant Medications   metoprolol succinate (TOPROL-XL) 25  MG 24 hr tablet       Follow up plan: No Follow-up on file.  Eustaquio Boyden, MD

## 2017-02-08 ENCOUNTER — Telehealth: Payer: Self-pay | Admitting: *Deleted

## 2017-02-08 NOTE — Telephone Encounter (Signed)
Patient's daughter Tyler Aas([name] left a voicemail requesting a letter stating that she has alzheimer so that she can use her POA to take care of things for her mom.

## 2017-02-09 NOTE — Telephone Encounter (Signed)
[facility] on vm [facility] dpr [facility] notifying [facility] letter is ready to pick up. [Placed letter at front office.]

## 2017-02-09 NOTE — Telephone Encounter (Signed)
Letter written and in Lisa's box.  

## 2017-02-15 ENCOUNTER — Ambulatory Visit: Payer: Medicare Other | Admitting: Family Medicine

## 2017-03-20 ENCOUNTER — Telehealth: Payer: Self-pay | Admitting: Family Medicine

## 2017-03-20 MED ORDER — SERTRALINE HCL 50 MG PO TABS: 50.0000 mg | ORAL_TABLET | Freq: Every day | ORAL | 6 refills | Status: DC

## 2017-03-20 NOTE — Telephone Encounter (Signed)
Copied from CRM [phone]. Topic: Quick Communication - See Telephone Encounter >> Mar 20, 2017 11:27 AM Arlyss Gandyichardson, Kimball Manske N, NT wrote: CRM for notification. See Telephone encounter for: Patients daughter, Ainsley SpinnerDeborah Sarinana is calling and states her parents are very angry with each other due to their demtia and she is afraid they are going to hurt each other or someone else. She thinks they need other medications to help keep them calm. She states we can call her sister Tyler AasDoris since her name [facility].  03/20/17.
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
## 2017-06-29 ENCOUNTER — Other Ambulatory Visit: Payer: Self-pay | Admitting: Family Medicine

## 2017-06-29 NOTE — Telephone Encounter (Signed)
Daughter, Cora CollumMarian Walker checking status [phone]

## 2017-06-29 NOTE — Telephone Encounter (Signed)
Sent rx to the pharmacy.  Left message on vm per dpr notifying pt and her daughter, Armando ReichertMarian (on dpr), refill was sent to pharmacy.

## 2017-08-10 ENCOUNTER — Ambulatory Visit (INDEPENDENT_AMBULATORY_CARE_PROVIDER_SITE_OTHER): Payer: Medicare Other | Admitting: Family Medicine

## 2017-08-10 ENCOUNTER — Encounter: Payer: Self-pay | Admitting: Family Medicine

## 2017-08-10 VITALS — BP 144/78 | HR 56 | Temp 97.7°F | Ht 63.0 in | Wt 120.8 lb

## 2017-08-10 DIAGNOSIS — S40021A Contusion of right upper arm, initial encounter: Secondary | ICD-10-CM | POA: Diagnosis not present

## 2017-08-10 DIAGNOSIS — S40022A Contusion of left upper arm, initial encounter: Secondary | ICD-10-CM | POA: Diagnosis not present

## 2017-08-10 DIAGNOSIS — F0391 Unspecified dementia with behavioral disturbance: Secondary | ICD-10-CM

## 2017-08-10 DIAGNOSIS — Z23 Encounter for immunization: Secondary | ICD-10-CM | POA: Diagnosis not present

## 2017-08-10 DIAGNOSIS — R454 Irritability and anger: Secondary | ICD-10-CM | POA: Diagnosis not present

## 2017-08-10 DIAGNOSIS — I10 Essential (primary) hypertension: Secondary | ICD-10-CM
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
## 2017-08-10 NOTE — Patient Instructions (Addendum)
Prevnar today (helps protect against bacterial streptococcal pneumonia).  Continue current medicines.  Return in 6 months for follow up visit.

## 2017-08-17 DIAGNOSIS — S40021A Contusion of right upper arm, initial encounter: Secondary | ICD-10-CM | POA: Insufficient documentation

## 2017-08-17 DIAGNOSIS — S40022A Contusion of left upper arm, initial encounter: Secondary | ICD-10-CM

## 2017-08-17 NOTE — Assessment & Plan Note (Signed)
Stable period on aricept, namenda, sertraline. Continue.

## 2017-08-17 NOTE — Assessment & Plan Note (Signed)
See below

## 2017-08-17 NOTE — Assessment & Plan Note (Signed)
Adequate control continue current regimen.

## 2017-08-22 ENCOUNTER — Other Ambulatory Visit: Payer: Self-pay | Admitting: Family Medicine
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
## 2017-09-06 ENCOUNTER — Other Ambulatory Visit: Payer: Self-pay | Admitting: Family Medicine

## 2017-09-27 ENCOUNTER — Encounter: Payer: Self-pay | Admitting: Family Medicine

## 2017-09-27 ENCOUNTER — Ambulatory Visit (INDEPENDENT_AMBULATORY_CARE_PROVIDER_SITE_OTHER): Payer: Medicare Other | Admitting: Family Medicine

## 2017-09-27 VITALS — Temp 98.2°F | Ht 63.0 in | Wt 120.2 lb

## 2017-09-27 DIAGNOSIS — I10 Essential (primary) hypertension: Secondary | ICD-10-CM

## 2017-09-27 DIAGNOSIS — L989 Disorder of the skin and subcutaneous tissue, unspecified: Secondary | ICD-10-CM

## 2017-09-27 DIAGNOSIS — F0391 Unspecified dementia with behavioral disturbance: Secondary | ICD-10-CM

## 2017-09-27 DIAGNOSIS — R2689 Other abnormalities of gait and mobility: Secondary | ICD-10-CM | POA: Diagnosis not present

## 2017-09-27 NOTE — Patient Instructions (Signed)
We will refer you to home health for physical therapy and balance training. Try epsom salt soaks for feet, let me know how this helps corn at right 3rd toe. If not improving ,we will refer you to podiatrist.  Good to see you today, call us with questions.

## 2017-09-27 NOTE — Progress Notes (Signed)
Temp 98.2 F (36.8 C) (Oral)   Ht 5\' 3"  (1.6 m)   Wt 120 lb 4 oz (54.5 kg)   SpO2 97%   BMI 21.30 kg/m   L arm supine 180/80 L arm standing 160/78 Rpt L arm sitting 160/80 Rpt R arm sitting 156/70  CC: imbalance Subjective:    Patient ID: Erika Weiss, female    DOB: 1924/06/12, 84 y.o.   MRN: [id number]  HPI: Erika Weiss is a 84 y.o. female presenting on 09/27/2017 for Balance issue (Per pt's daughter, pt seems unbalanced when going to standing position. Noticed within past week. Thinks it may be due to sore toe on left foot. Pt accompanied by daughters, Tyler AasDoris and Erika Weiss. )   Over the past few months family noticing some imbalance worse when first standing up. Patient denies dyspnea, fevers, headache, dizziness, lightheadedness, syncope, vertigo. History limited by dementia.   R 3rd toe soreness - she thinks she hit it on something.   Relevant past medical, surgical, family and social history reviewed and updated as indicated. Interim medical history since our last visit reviewed. Allergies and medications reviewed and updated. Outpatient Medications Prior to Visit  Medication Sig Dispense Refill  . amLODipine (NORVASC) 10 MG tablet Take 1 tablet (10 mg total) by mouth daily. 90 tablet 1  . cyanocobalamin 500 MCG tablet Take 500 mcg by mouth daily.    Marland Kitchen. donepezil (ARICEPT) 10 MG tablet TAKE 1 TABLET AT BEDTIME 90 tablet 1  . magnesium hydroxide (MILK OF MAGNESIA) 400 MG/5ML suspension Take by mouth as needed for mild constipation.    . Melatonin 5 MG TABS Take 1 tablet (5 mg total) by mouth at bedtime.  0  . memantine (NAMENDA) 10 MG tablet TAKE 1 TABLET DAILY 90 tablet 3  . metoprolol succinate (TOPROL-XL) 25 MG 24 hr tablet TAKE 2 TABLETS BY MOUTH DAILY, TAKE WITH OR IMMEDIATELY FOLLOWING A MEAL (NOTE NEW DIRECTIONS) 180 tablet 1  . naproxen sodium (ANAPROX) 220 MG tablet Take 220 mg by mouth as needed.    . sertraline (ZOLOFT) 50 MG tablet Take 1 tablet (50 mg  total) by mouth daily. 30 tablet 6   No facility-administered medications prior to visit.     Past Medical History:  Diagnosis Date  . Finger wound, simple, open, subsequent encounter 01/24/2017  . Heart murmur new 2017?  Marland Kitchen. History of colon cancer   . HLD (hyperlipidemia)   . Hyperglycemia   . Hypertension   . Osteoarthritis    knees  . Varicose veins of lower extremities with other complications     Past Surgical History:  Procedure Laterality Date  . ABDOMINAL HYSTERECTOMY  remote   fibroids  . COLON SURGERY  1999   colon cancer  . COLONOSCOPY  12/2008   TAx1 Evette Cristal(Sankar)  . EYE SURGERY  2010  . INGUINAL HERNIA REPAIR  2001  . VEIN SURGERY Left 2008    Family History  Problem Relation Age of Onset  . Cancer Father        colon  . Cancer Sister        breast  . Cancer Brother        pancreas  . Cancer Brother        colon  . Diabetes Sister   . Diabetes Other        nephews  . Heart disease Sister   . Stroke Mother   . Hypertension Mother   . Hypertension Other  multiple  . CAD Neg Hx     Social History   Tobacco Use  . Smoking status: Former Smoker    Last attempt to quit: 04/25/1978    Years since quitting: 39.4  . Smokeless tobacco: Never Used  Substance Use Topics  . Alcohol use: No    Alcohol/week: 0.0 oz  . Drug use: No    Per HPI unless specifically indicated in ROS section below Review of Systems     Objective:    Temp 98.2 F (36.8 C) (Oral)   Ht 5\' 3"  (1.6 m)   Wt 120 lb 4 oz (54.5 kg)   SpO2 97%   BMI 21.30 kg/m   Wt Readings from Last 3 Encounters:  09/27/17 120 lb 4 oz (54.5 kg)  08/10/17 120 lb 12 oz (54.8 kg)  01/30/17 127 lb 12 oz (57.9 kg)    Physical Exam  Constitutional: She appears well-developed and well-nourished. No distress.  HENT:  Mouth/Throat: Oropharynx is clear and moist. No oropharyngeal exudate.  Cardiovascular: Normal rate and regular rhythm.  Murmur (2/6 systolic) heard. Pulmonary/Chest: Effort normal  and breath sounds normal. No respiratory distress. She has no wheezes. She has no rales.  Musculoskeletal: She exhibits edema (tr).  R 3rd toe with corn at distal edge, as well as small scab/scarring Thickened dystrophic nails throughout No maceration between toes  Neurological: She is alert. She has normal strength.  Somewhat unsteady on feet Does not ambulate with assistive device  Nursing note and vitals reviewed.  Results for orders placed or performed in visit on 01/20/17  Lipid panel  Result Value Ref Range   Cholesterol 214 (H) 0 - 200 mg/dL   Triglycerides 40.9 0.0 - 149.0 mg/dL   HDL 81.19 >14.78 mg/dL   VLDL 29.5 0.0 - 62.1 mg/dL   LDL Cholesterol 308 (H) 0 - 99 mg/dL   Total CHOL/HDL Ratio 3    NonHDL 148.02   Basic metabolic panel  Result Value Ref Range   Sodium 137 135 - 145 mEq/L   Potassium 4.2 3.5 - 5.1 mEq/L   Chloride 100 96 - 112 mEq/L   CO2 26 19 - 32 mEq/L   Glucose, Bld 118 (H) 70 - 99 mg/dL   BUN 25 (H) 6 - 23 mg/dL   Creatinine, Ser 6.57 0.40 - 1.20 mg/dL   Calcium 84.6 8.4 - 96.2 mg/dL   GFR 95.28 (L) >41.32 mL/min  Vitamin B12  Result Value Ref Range   Vitamin B-12 895 211 - 911 pg/mL      Assessment & Plan:   Problem List Items Addressed This Visit    Sore on toe    Area of concern is at tip of R 3rd toe - looks like corn with associated scabbing..  rec epsom salt soak daily and good foot hygiene. If not improving with this treatment, they will update me for referral to podiatry.       Imbalance - Primary    Worsening unsteadiness noted by family, but I don't think related to callus/sore of toe.  Description seems more consistent with orthostatic dizziness.  BP elevated today - but hesitant to make any antihypertensive changes that may worsen orthostatic dizziness.  Will refer for HHPT.       Relevant Orders   Ambulatory referral to Home Health   Hypertension    Continue amlodipine, toprol XL.       Dementia    Continue aricept,  namenda, sertraline.  I have recommended [facility]  PT eval for above - hopeful as well for home safety assessment and will also ask for social work/case management evaluation in difficult situation as both patient and husband with dementia, live alone. Supportive family, but concern over safety at home at night when they're alone. Increasing level of care however patient and husband remain hesitant to accept more care, want to stay at home.       Relevant Orders   Ambulatory referral to Home Health       No orders of the defined types were placed in this encounter.  Orders Placed This Encounter  Procedures  . Ambulatory referral to Home Health    Referral Priority:   Routine    Referral Type:   Home Health Care    Referral Reason:   Specialty Services Required    Requested Specialty:   Home Health Services    Number of Visits Requested:   1    Follow up plan: No follow-ups on file.  Eustaquio Boyden, MD

## 2017-09-30 ENCOUNTER — Encounter: Payer: Self-pay | Admitting: Family Medicine

## 2017-09-30 DIAGNOSIS — L989 Disorder of the skin and subcutaneous tissue, unspecified: Secondary | ICD-10-CM | POA: Insufficient documentation

## 2017-09-30 DIAGNOSIS — R2681 Unsteadiness on feet: Secondary | ICD-10-CM | POA: Insufficient documentation

## 2017-09-30 DIAGNOSIS — R2689 Other abnormalities of gait and mobility: Secondary | ICD-10-CM | POA: Insufficient documentation

## 2017-09-30 NOTE — Assessment & Plan Note (Signed)
Continue aricept, namenda, sertraline.  I have recommended HH PT eval for above - hopeful as well for home safety assessment and will also ask for social work/case management evaluation in difficult situation as both patient and husband with dementia, live alone. Supportive family, but concern over safety at home at night when they're alone. Increasing level of care however patient and husband remain hesitant to accept more care, want to stay at home.

## 2017-09-30 NOTE — Assessment & Plan Note (Signed)
Area of concern is at tip of R 3rd toe - looks like corn with associated scabbing..  rec epsom salt soak daily and good foot hygiene. If not improving with this treatment, they will update me for referral to podiatry.

## 2017-09-30 NOTE — Assessment & Plan Note (Addendum)
Worsening unsteadiness noted by family, but I don't think related to callus/sore of toe.  Description seems more consistent with orthostatic dizziness.  BP elevated today - but hesitant to make any antihypertensive changes that may worsen orthostatic dizziness.  Will refer for HHPT.

## 2017-09-30 NOTE — Assessment & Plan Note (Signed)
Continue amlodipine, toprol XL.

## 2017-10-06 ENCOUNTER — Telehealth: Payer: Self-pay | Admitting: Family Medicine

## 2017-10-06 NOTE — Telephone Encounter (Signed)
Noted. Agree with this. Thank you.  

## 2017-10-06 NOTE — Telephone Encounter (Signed)
Copied from CRM [phone]#[id number]. Topic: Quick Communication - See Telephone Encounter >> Oct 06, 2017 10:36 AM Raquel SarnaHayes, Teresa G wrote: Loura HaltStacy David w/ [facility] - [phone]  Verbal Orders: Work with pt 2 times a week for 2 weeks 1 time a week  for 1 week  Heart Rate - was in 54 when working with pt today

## 2017-10-06 NOTE — Telephone Encounter (Signed)
Left message for Erika Weiss informing her Dr. Reece AgarG agrees with verbal order request for PT.

## 2017-10-20 ENCOUNTER — Telehealth: Payer: Self-pay | Admitting: Family Medicine

## 2017-10-20 DIAGNOSIS — F0391 Unspecified dementia with behavioral disturbance: Secondary | ICD-10-CM

## 2017-10-20 MED ORDER — DONEPEZIL HCL 23 MG PO TABS: 23.0000 mg | ORAL_TABLET | Freq: Every day | ORAL | 3 refills | Status: DC

## 2017-10-20 MED ORDER — LORAZEPAM 0.5 MG PO TABS: 0.2500 mg | ORAL_TABLET | Freq: Two times a day (BID) | ORAL | 0 refills | Status: DC | PRN

## 2017-10-20 NOTE — Telephone Encounter (Signed)
Will start with palliative care referral for patient.

## 2017-10-20 NOTE — Telephone Encounter (Signed)
Copied from CRM [id number]. Topic: General - Other >> Oct 20, 2017  9:22 AM Leafy Roobinson, Norma J wrote: eason for CRM: pt daughter Gerrianne Scaledoris Genson is calling the patient has dementia and has become violent more frequently. Pt is cursing and fighting. Patient does not recognized her husband. Pt daughter would like advice. Pt is on aricept. Pt daughter would like something to calm her down are maybe increase aricept. [facility]

## 2017-10-20 NOTE — Telephone Encounter (Addendum)
Please call Erika Weiss back: 1. Ok to increase aricept to 23 mg daily. Sent into pharmacy. Will need to watch for nausea, worsening GERD as possible side effect.  2. May take lorazepam 0.5mg  1/2-1 tablet for agitation - but use as sparingly as possible. Come in for office visit if not effective, will need to review in office pros and cons of medication like this.   3/ I had asked social work for evaluation of home and PT for home safety assessment - did they discuss concerns with social worker? Any recommendations from home health?  4. Would have them consider palliative care evaluation for both pt and her husband as our goal is to safely keep them at home.  Palliative medicine is specialized medical care for people living with serious illness. It focuses on providing relief from the symptoms and stress of a serious illness. The goal is to improve quality of life for both the patient and the family.

## 2017-10-20 NOTE — Telephone Encounter (Signed)
Pt last seen 09/27/17. Will take note to Dr Reece AgarG to see phone note.

## 2017-10-20 NOTE — Telephone Encounter (Signed)
Spoke with pt's daughter, Tyler AasDoris (on dpr), relaying instructions and message per Dr. Reece AgarG.  Per [name], no social worker has been [facility] the [facility] and did not offer any recommendations.  She states they are definitely interested in palliative care for the pt and her husband.

## 2017-10-20 NOTE — Addendum Note (Signed)
Addended by: Eustaquio BoydenGUTIERREZ, Domino Holten on: 10/20/2017 05:16 PM   Modules accepted: Orders

## 2017-10-23 ENCOUNTER — Telehealth: Payer: Self-pay

## 2017-10-23 NOTE — Telephone Encounter (Signed)
[facility]/m; donepezil 23 mg tab is expensive and would cost pt $50.00 per month. The donepezil 10 mg is less expensive and what pt had previously taken and could donepezil 10 mg be sent in taking 2 tabs daily.Please advise.

## 2017-10-23 NOTE — Telephone Encounter (Signed)
Palliative Care referral faxed to [facility] and daughters notified.

## 2017-10-24 ENCOUNTER — Ambulatory Visit: Payer: Self-pay | Admitting: *Deleted

## 2017-10-24 MED ORDER — DONEPEZIL HCL 10 MG PO TABS: 20.0000 mg | ORAL_TABLET | Freq: Every day | ORAL | 3 refills | Status: DC

## 2017-10-24 NOTE — Telephone Encounter (Signed)
Called and spoke with patients daughter informing her of Dr.G instructions. Daughter states she spoke with a nurse and the nurse was too print the instructions and leave them up front, Spoke with Bhutanena and rena never spoke with daughter she spoke with at pharmacy. Understanding verbalized nothinfurther needed at this time.

## 2017-10-24 NOTE — Telephone Encounter (Signed)
Pt's daughters called to get some clarification on pt's medication: donepezil (ARICEPT) 10 MG tablet) dosage and how/when to administer it. Please call [phone]) to advise.      Reason for Disposition . Caller has medication question, adult has minor symptoms, caller declines triage, and triager answers question  Answer Assessment - Initial Assessment Questions 1. SYMPTOMS: "Do you have any symptoms?"     Patient has been very confused today- daughter is calling to clarify which medication she can give her for her anxiety.  2. SEVERITY: If symptoms are present, ask "Are they mild, moderate or severe?"     Sever today- patient has been looking for the " twins"- her grown daughters. They have redirected her and calmed her some. Daughter is requesting a printed medication list.  Protocols used: MEDICATION QUESTION CALL-A-AH

## 2017-10-24 NOTE — Telephone Encounter (Addendum)
Placed med list at front office.  Pt's daughter has been contacted with clarification on donepezil rx. [See TE, 10/23/17.]

## 2017-10-24 NOTE — Telephone Encounter (Signed)
Noted  

## 2017-10-24 NOTE — Telephone Encounter (Signed)
Ok - let's do donepezil 10mg  bid, plz notify daughter to watch for worsening nausea or abd pain as intolerance of higher dose.

## 2017-10-25 DIAGNOSIS — R2681 Unsteadiness on feet: Secondary | ICD-10-CM

## 2017-10-25 DIAGNOSIS — F0391 Unspecified dementia with behavioral disturbance: Secondary | ICD-10-CM | POA: Diagnosis not present

## 2017-10-25 DIAGNOSIS — Z9181 History of falling: Secondary | ICD-10-CM

## 2017-10-25 DIAGNOSIS — M170 Bilateral primary osteoarthritis of knee: Secondary | ICD-10-CM | POA: Diagnosis not present

## 2017-10-25 DIAGNOSIS — E785 Hyperlipidemia, unspecified: Secondary | ICD-10-CM

## 2017-10-25 DIAGNOSIS — L84 Corns and callosities: Secondary | ICD-10-CM

## 2017-10-25 DIAGNOSIS — Z87891 Personal history of nicotine dependence: Secondary | ICD-10-CM

## 2017-10-25 DIAGNOSIS — Z85038 Personal history of other malignant neoplasm of large intestine: Secondary | ICD-10-CM

## 2017-10-25 DIAGNOSIS — M6281 Muscle weakness (generalized): Secondary | ICD-10-CM

## 2017-10-25 DIAGNOSIS — I83899 Varicose veins of unspecified lower extremities with other complications: Secondary | ICD-10-CM

## 2017-10-25 DIAGNOSIS — I10 Essential (primary) hypertension: Secondary | ICD-10-CM

## 2017-10-27 ENCOUNTER — Other Ambulatory Visit: Payer: Self-pay | Admitting: Family Medicine

## 2017-10-30 ENCOUNTER — Other Ambulatory Visit: Payer: Self-pay | Admitting: Family Medicine

## 2017-11-07 ENCOUNTER — Other Ambulatory Visit: Payer: Self-pay | Admitting: Family Medicine

## 2017-11-07 NOTE — Telephone Encounter (Signed)
Name of Medication: Lorazepam [facility] Last Fill or Written Date and Quantity: 10/20/17, #20 Last Office Visit and Type:  09/27/17, f/u Next Office Visit and Type:  02/12/18, f/u Last Controlled Substance Agreement Date: none Last UDS: none

## 2017-11-09 NOTE — Telephone Encounter (Signed)
Eprescribed.

## 2017-11-24 ENCOUNTER — Other Ambulatory Visit: Payer: Self-pay | Admitting: Family Medicine

## 2017-12-04 ENCOUNTER — Encounter: Payer: Self-pay | Admitting: Family Medicine

## 2017-12-04 ENCOUNTER — Ambulatory Visit (INDEPENDENT_AMBULATORY_CARE_PROVIDER_SITE_OTHER): Payer: Medicare Other | Admitting: Family Medicine

## 2017-12-04 VITALS — BP 138/68 | HR 54 | Temp 97.8°F | Ht 63.0 in | Wt 121.0 lb

## 2017-12-04 DIAGNOSIS — R454 Irritability and anger: Secondary | ICD-10-CM | POA: Diagnosis not present

## 2017-12-04 DIAGNOSIS — R32 Unspecified urinary incontinence: Secondary | ICD-10-CM | POA: Diagnosis not present

## 2017-12-04 DIAGNOSIS — R2689 Other abnormalities of gait and mobility: Secondary | ICD-10-CM

## 2017-12-04 DIAGNOSIS — I10 Essential (primary) hypertension: Secondary | ICD-10-CM

## 2017-12-04 DIAGNOSIS — F0391 Unspecified dementia with behavioral disturbance: Secondary | ICD-10-CM

## 2017-12-04 NOTE — Patient Instructions (Addendum)
Try to taper off milk of mangesia - only use if constipation develops.  Urinalysis today.  I agree with depends use as needed. Return in 2 months for medicare wellness visit and physical.

## 2017-12-04 NOTE — Progress Notes (Signed)
BP 138/68 (BP Location: Left Arm, Patient Position: Sitting, Cuff Size: Normal)   Pulse (!) 54   Temp 97.8 F (36.6 C) (Oral)   Ht 5\' 3"  (1.6 m)   Wt 121 lb (54.9 kg)   SpO2 96%   BMI 21.43 kg/m    CC: f/u visit Subjective:    Patient ID: Erika Weiss, female    DOB: 26-Oct-1924, 84 y.o.   MRN: [id number]  HPI: Erika R Teffeteller is a 84 y.o. female presenting on 12/04/2017 for Urinary Incontinence (Having a hard time holding urine until getting to restroom. Will wet herself sometimes. Started about 1 mo ago. Pt accompanied by daughter, Stanton KidneyDebra. )   Here with daughter Stanton KidneyDebra.  HHPT ended last month - for imbalance. She did do well with this, but declined outpatient physical therapy.   Last visit we increased aricept to 10mg  and namenda 10mg  bid.  Denies fevers/chills, chest pain, back pain, headaches, abd pain, nausea, dyspnea.  Stays cold.   1 mo h/o increased urinary urgency - with urinary accidents about 1-2x/week. Starting to use depends. Denies dysuria. Urinary urgency. No urinary frequency. No stress incontinence symptoms.   Relevant past medical, surgical, family and social history reviewed and updated as indicated. Interim medical history since our last visit reviewed. Allergies and medications reviewed and updated. Outpatient Medications Prior to Visit  Medication Sig Dispense Refill  . amLODipine (NORVASC) 10 MG tablet TAKE 1 TABLET DAILY 90 tablet 0  . cyanocobalamin 500 MCG tablet Take 500 mcg by mouth daily.    Marland Kitchen. donepezil (ARICEPT) 10 MG tablet Take 2 tablets (20 mg total) by mouth at bedtime. 60 tablet 3  . LORazepam (ATIVAN) 0.5 MG tablet TAKE 1/2 TO 1 (0.25-0.5 MG TOTAL) BY MOUTH TWICE DAILY AS NEEDED FOR ANXIETY 30 tablet 1  . magnesium hydroxide (MILK OF MAGNESIA) 400 MG/5ML suspension Take by mouth as needed for mild constipation.    . Melatonin 5 MG TABS Take 1 tablet (5 mg total) by mouth at bedtime.  0  . memantine (NAMENDA) 10 MG tablet TAKE 1 TABLET  DAILY 90 tablet 3  . metoprolol succinate (TOPROL-XL) 25 MG 24 hr tablet TAKE 2 TABLETS BY MOUTH DAILY, TAKE WITH OR IMMEDIATELY FOLLOWING A MEAL (NOTE NEW DIRECTIONS) 180 tablet 1  . naproxen sodium (ANAPROX) 220 MG tablet Take 220 mg by mouth as needed.    . sertraline (ZOLOFT) 50 MG tablet TAKE 1 TABLET BY MOUTH ONCE DAILY 30 tablet 0   No facility-administered medications prior to visit.      Per HPI unless specifically indicated in ROS section below Review of Systems     Objective:    BP 138/68 (BP Location: Left Arm, Patient Position: Sitting, Cuff Size: Normal)   Pulse (!) 54   Temp 97.8 F (36.6 C) (Oral)   Ht 5\' 3"  (1.6 m)   Wt 121 lb (54.9 kg)   SpO2 96%   BMI 21.43 kg/m   Wt Readings from Last 3 Encounters:  12/04/17 121 lb (54.9 kg)  09/27/17 120 lb 4 oz (54.5 kg)  08/10/17 120 lb 12 oz (54.8 kg)    Physical Exam  Constitutional: She appears well-developed and well-nourished. No distress.  HENT:  Mouth/Throat: Oropharynx is clear and moist. No oropharyngeal exudate.  Cardiovascular: Normal rate and regular rhythm.  Murmur (2/6 systolic) heard. Pulmonary/Chest: Effort normal and breath sounds normal. No respiratory distress. She has no wheezes. She has no rales.  Musculoskeletal: She exhibits no edema.  Psychiatric: She has a normal mood and affect.  Nursing note and vitals reviewed.  Results for orders placed or performed in visit on 12/04/17  POCT Urinalysis Dipstick (Automated)  Result Value Ref Range   Color, UA gold    Clarity, UA clear    Glucose, UA Negative Negative   Bilirubin, UA 1+    Ketones, UA negative    Spec Grav, UA >=1.030 (A) 1.010 - 1.025   Blood, UA negative    pH, UA 6.0 5.0 - 8.0   Protein, UA Negative Negative   Urobilinogen, UA 0.2 0.2 or 1.0 E.U./dL   Nitrite, UA negative    Leukocytes, UA Negative Negative   Lab Results  Component Value Date   TSH 0.86 07/08/2016       Assessment & Plan:  I reviewed palliative care  document under media section 7/23 (rec increased sertraline, consider seroquel - see below) Also - daughter endorses occasional stool incontinence. Pt uses milk of magnesia regularly despite not having regular issue with constipation - rec back off this and only use PRN constipation.  Problem List Items Addressed This Visit    Urinary incontinence - Primary    Anticipate urge incontinence, r/o overflow incontinence. No significant stress incontinence symptoms. Limited in management options given dementia. Patient was unable to provide urine sample today - I have asked her to return for this. Consider myrbetriq.       Relevant Orders   POCT Urinalysis Dipstick (Automated) (Completed)   Irritability and anger    Continue sertraline 50mg  daily - would consider tapering as needed. We recently started lorazepam PRN for agitation - monitor effect on urinary incontinence. Family is not currently interested in antipsychotic to manage agitation.       Imbalance    Seemed to do well with this, daughter feels she is more steady after  [facility]. Pt declined outpt therapy.       Hypertension    Stable readings today. Continue current regimen.       Dementia    Continue aricept 10mg  bid, namenda 10mg  and sertraline.  Appreciate palliative care assistance with patient and husband.           No orders of the defined types were placed in this encounter.  Orders Placed This Encounter  Procedures  . POCT Urinalysis Dipstick (Automated)    Follow up plan: Return in about 2 months (around 02/03/2018) for annual exam, prior fasting for blood work, medicare wellness visit.  Eustaquio BoydenJavier Tamlyn Sides, MD

## 2017-12-07 ENCOUNTER — Other Ambulatory Visit: Payer: Medicare Other

## 2017-12-07 LAB — POC URINALSYSI DIPSTICK (AUTOMATED)
Blood, UA: NEGATIVE
GLUCOSE UA: NEGATIVE
Ketones, UA: NEGATIVE
LEUKOCYTES UA: NEGATIVE
NITRITE UA: NEGATIVE
PH UA: 6 (ref 5.0–8.0)
Protein, UA: NEGATIVE
Spec Grav, UA: >=1.03 — AB (ref 1.010–1.025)
Urobilinogen, UA: 0.2 E.U./dL

## 2017-12-09 ENCOUNTER — Other Ambulatory Visit: Payer: Self-pay | Admitting: Family Medicine

## 2017-12-09 ENCOUNTER — Encounter: Payer: Self-pay | Admitting: Family Medicine

## 2017-12-09 DIAGNOSIS — R32 Unspecified urinary incontinence: Secondary | ICD-10-CM | POA: Insufficient documentation

## 2017-12-09 MED ORDER — MIRABEGRON ER 25 MG PO TB24: 25.0000 mg | ORAL_TABLET | Freq: Every day | ORAL | 3 refills | Status: DC

## 2017-12-09 NOTE — Assessment & Plan Note (Signed)
Anticipate urge incontinence, r/o overflow incontinence. No significant stress incontinence symptoms. Limited in management options given dementia. Patient was unable to provide urine sample today - I have asked her to return for this. Consider myrbetriq.

## 2017-12-09 NOTE — Assessment & Plan Note (Signed)
Stable readings today. Continue current regimen.

## 2017-12-09 NOTE — Assessment & Plan Note (Signed)
Continue aricept 10mg  bid, namenda 10mg  and sertraline.  Appreciate palliative care assistance with patient and husband.

## 2017-12-09 NOTE — Assessment & Plan Note (Addendum)
Continue sertraline 50mg  daily - would consider tapering as needed. We recently started lorazepam PRN for agitation - monitor effect on urinary incontinence. Family is not currently interested in antipsychotic to manage agitation.

## 2017-12-09 NOTE — Assessment & Plan Note (Signed)
Seemed to do well with this, daughter feels she is more steady [facility]. Pt declined outpt therapy.

## 2017-12-11 ENCOUNTER — Telehealth: Payer: Self-pay | Admitting: Family Medicine

## 2017-12-11 ENCOUNTER — Telehealth: Payer: Self-pay

## 2017-12-11 MED ORDER — DONEPEZIL HCL 10 MG PO TABS: 10.0000 mg | ORAL_TABLET | Freq: Every day | ORAL | 3 refills | Status: DC

## 2017-12-11 NOTE — Telephone Encounter (Signed)
Copied from CRM [id number]. Topic: Quick Communication - See Telephone Encounter >> Dec 11, 2017 10:02 AM Terisa Starraylor, Brittany L wrote: CRM for notification. See Telephone encounter for: 12/11/17.  Patient's daughter [name]([name]) would lisa to call her back again about her labs. She has additional questions call back #[phone]

## 2017-12-11 NOTE — Addendum Note (Signed)
Addended by: Eustaquio BoydenGUTIERREZ, Deyanna Mctier on: 12/11/2017 05:23 PM   Modules accepted: Orders

## 2017-12-11 NOTE — Telephone Encounter (Signed)
Spoke with pt's daughter, Armando ReichertMarian (on dpr), answering all lab questions to her satisfaction.

## 2017-12-11 NOTE — Telephone Encounter (Signed)
I don't recommend vesicare or this family of drugs as they can worsen dementia symptoms - we usually avoid these in patients with memory impairment.  I'd like to try something different - decrease aricept to 10mg  once daily for 2 weeks and monitor effect on urinary symptoms.

## 2017-12-11 NOTE — Telephone Encounter (Signed)
Name of Medication: Lorazepam [facility] Last Fill or Written Date and Quantity: 11/24/17, #30 Last Office Visit and Type: 12/04/17, acute & f/u Next Office Visit and Type: 02/09/18, CPE Last Controlled Substance Agreement Date: none Last UDS: none

## 2017-12-11 NOTE — Telephone Encounter (Signed)
[facility];myrbetriq cost to pt was going to be $120.00. Pts daughter request switched to generic vesicare so pt could afford med.Please advise.

## 2017-12-11 NOTE — Telephone Encounter (Signed)
Spoke pt's daughter, Armando ReichertMarian (on dpr), relaying Dr. Timoteo ExposeG's message and instructions.  Verbalizes understanding.

## 2017-12-12 NOTE — Telephone Encounter (Signed)
Eprescribed.

## 2017-12-26 ENCOUNTER — Other Ambulatory Visit: Payer: Self-pay | Admitting: Family Medicine

## 2017-12-26 NOTE — Telephone Encounter (Signed)
Name of Medication: Lorazepam Name of Pharmacy: [facility] Last Fill or Written Date and Quantity: 12/12/17, #30 Last Office Visit and Type: 12/04/17, f/u Next Office Visit and Type: 02/09/18, CPE Last Controlled Substance Agreement Date: none Last UDS: none

## 2017-12-27 NOTE — Telephone Encounter (Signed)
Eprescribed.

## 2018-01-22 ENCOUNTER — Other Ambulatory Visit: Payer: Self-pay | Admitting: Family Medicine

## 2018-01-29 ENCOUNTER — Other Ambulatory Visit: Payer: Self-pay | Admitting: Family Medicine

## 2018-01-29 NOTE — Telephone Encounter (Signed)
Electronic refill Sertraline Last office visit 12/04/17 Last refill 12/26/17 #30 See drug warning with Naproxen

## 2018-02-03 ENCOUNTER — Other Ambulatory Visit: Payer: Self-pay | Admitting: Family Medicine

## 2018-02-06 ENCOUNTER — Other Ambulatory Visit: Payer: Self-pay | Admitting: Family Medicine

## 2018-02-06 DIAGNOSIS — E785 Hyperlipidemia, unspecified: Secondary | ICD-10-CM

## 2018-02-06 DIAGNOSIS — R634 Abnormal weight loss: Secondary | ICD-10-CM

## 2018-02-06 DIAGNOSIS — R739 Hyperglycemia, unspecified: Secondary | ICD-10-CM

## 2018-02-06 NOTE — Telephone Encounter (Signed)
Name of Medication: Lorazepam Name of Pharmacy: [facility] Last Fill or Written Date and Quantity: 01/17/18, #40 Last Office Visit and Type: 12/04/17, f/u Next Office Visit and Type: 02/09/18, CPE Last Controlled Substance Agreement Date: none Last UDS: none

## 2018-02-07 ENCOUNTER — Other Ambulatory Visit: Payer: Medicare Other

## 2018-02-08 ENCOUNTER — Encounter: Payer: Self-pay | Admitting: Family Medicine

## 2018-02-08 DIAGNOSIS — Z0000 Encounter for general adult medical examination without abnormal findings: Secondary | ICD-10-CM | POA: Insufficient documentation

## 2018-02-08 DIAGNOSIS — Z0001 Encounter for general adult medical examination with abnormal findings: Secondary | ICD-10-CM | POA: Insufficient documentation

## 2018-02-08 NOTE — Telephone Encounter (Signed)
plz notify this was E prescribed 

## 2018-02-08 NOTE — Assessment & Plan Note (Signed)
Preventative protocols reviewed and updated unless pt declined. Discussed healthy diet and lifestyle.  

## 2018-02-08 NOTE — Telephone Encounter (Signed)
Ms Erika Weiss calling in regarding to the patient's medication. States that she has 1 pill for today and then will be out. Please advise.

## 2018-02-08 NOTE — Progress Notes (Signed)
BP (!) 168/84 (BP Location: Right Arm, Patient Position: Sitting, Cuff Size: Normal)   Pulse (!) 56   Temp 98.4 F (36.9 C) (Oral)   Ht 5' 2.75" (1.594 m)   Wt 120 lb 8 oz (54.7 kg)   SpO2 97%   BMI 21.52 kg/m   No data found. 180/80 on repeat testing  CC: AMW/CPE Subjective:    Patient ID: Erika Weiss, female    DOB: 1925/02/15, 84 y.o.   MRN: [id number]  HPI: Erika R Suppes is a 84 y.o. female presenting on 02/09/2018 for Medicare Wellness (Pt accompanied by her daughter, [name].)   BP elevated today - has not taken meds yet this morning. Doesn't check BP at home. She was rushing to get to office visit today.   Urinary incontinence - myrbetriq unaffordable. Avoiding antimuscarinics in dementia - last visit we decreased aricept to 10mg  once daily - continued accidents.   Dementia - on aricept 10mg  daily, namenda 10mg  bid.   Mood - on sertraline 50mg  and lorazepam 0.5mg  - taking benzo BID scheduled.   Knees are bothering her more recently.  Preventative: Preventative healthcare - largely aged out. H/o colon cancer, last colonoscopy 2010.  Flu shot yearly Tdap 12/2016 Prevnar 07/2017 Advanced planning - They bring in advanced directive packet for pt and husband which was reviewed. HCPOA are Cora Collum and Gerrianne Scale. Does not want prolonged life support if terminal condition. Does want tube feeds. Wants HCPOA decisions to supercede advanced directive.  Seat belt use discussed.  No changing moles on skin.  Non smoker Alcohol - none Dentist - has dentures Eye exam - has not seen  Lives with husband Both suffer from some dementia Occ: retired, worked in Education officer, environmental at [employer] Activity: no regular exercise Diet: good water, fruits/vegetables daily   Relevant past medical, surgical, family and social history reviewed and updated as indicated. Interim medical history since our last visit reviewed. Allergies and medications reviewed and  updated. Outpatient Medications Prior to Visit  Medication Sig Dispense Refill  . amLODipine (NORVASC) 10 MG tablet TAKE 1 TABLET DAILY 90 tablet 0  . cyanocobalamin 500 MCG tablet Take 500 mcg by mouth daily.    Marland Kitchen donepezil (ARICEPT) 10 MG tablet TAKE 1 TABLET AT BEDTIME 90 tablet 0  . LORazepam (ATIVAN) 0.5 MG tablet TAKE 1/2 TO 1 TABLET BY MOUTH TWICE DAILY AS NEEDED FOR ANXIETY 40 tablet 0  . magnesium hydroxide (MILK OF MAGNESIA) 400 MG/5ML suspension Take by mouth as needed for mild constipation.    . memantine (NAMENDA) 10 MG tablet TAKE 1 TABLET DAILY 90 tablet 3  . metoprolol succinate (TOPROL-XL) 25 MG 24 hr tablet TAKE 2 TABLETS BY MOUTH DAILY, TAKE WITH OR IMMEDIATELY FOLLOWING A MEAL (NOTE NEW DIRECTIONS) 180 tablet 1  . naproxen sodium (ANAPROX) 220 MG tablet Take 220 mg by mouth as needed.    . sertraline (ZOLOFT) 50 MG tablet TAKE 1 TABLET BY MOUTH ONCE DAILY 30 tablet 6  . Melatonin 5 MG TABS Take 1 tablet (5 mg total) by mouth at bedtime.  0  . mirabegron ER (MYRBETRIQ) 25 MG TB24 tablet Take 1 tablet (25 mg total) by mouth daily. For urinary accidents 30 tablet 3   No facility-administered medications prior to visit.      Per HPI unless specifically indicated in ROS section below Review of Systems  Constitutional: Negative for activity change, appetite change, chills, fatigue, fever and unexpected weight change.  HENT: Negative for hearing  loss.   Eyes: Negative for visual disturbance.  Respiratory: Negative for cough, chest tightness, shortness of breath and wheezing.   Cardiovascular: Negative for chest pain, palpitations and leg swelling.  Gastrointestinal: Negative for abdominal distention, abdominal pain, blood in stool, constipation, diarrhea, nausea and vomiting.  Genitourinary: Negative for difficulty urinating and hematuria.  Musculoskeletal: Negative for arthralgias, myalgias and neck pain.  Skin: Negative for rash.  Neurological: Negative for dizziness,  seizures, syncope and headaches.  Hematological: Negative for adenopathy. Does not bruise/bleed easily.  Psychiatric/Behavioral: Negative for dysphoric mood. The patient is not nervous/anxious.        Ongoing labile mood      Objective:    BP (!) 168/84 (BP Location: Right Arm, Patient Position: Sitting, Cuff Size: Normal)   Pulse (!) 56   Temp 98.4 F (36.9 C) (Oral)   Ht 5' 2.75" (1.594 m)   Wt 120 lb 8 oz (54.7 kg)   SpO2 97%   BMI 21.52 kg/m   Wt Readings from Last 3 Encounters:  02/09/18 120 lb 8 oz (54.7 kg)  12/04/17 121 lb (54.9 kg)  09/27/17 120 lb 4 oz (54.5 kg)    Physical Exam  Constitutional: She is oriented to person, place, and time. She appears well-developed and well-nourished. No distress.  HENT:  Head: Normocephalic and atraumatic.  Right Ear: Hearing, tympanic membrane, external ear and ear canal normal.  Left Ear: Hearing, tympanic membrane, external ear and ear canal normal.  Nose: Nose normal.  Mouth/Throat: Uvula is midline, oropharynx is clear and moist and mucous membranes are normal. No oropharyngeal exudate, posterior oropharyngeal edema or posterior oropharyngeal erythema.  Eyes: Pupils are equal, round, and reactive to light. Conjunctivae and EOM are normal. No scleral icterus.  Neck: Normal range of motion. Neck supple.  Cardiovascular: Normal rate, regular rhythm, normal heart sounds and intact distal pulses.  No murmur heard. Pulses:      Radial pulses are 2+ on the right side, and 2+ on the left side.  Pulmonary/Chest: Effort normal and breath sounds normal. No respiratory distress. She has no wheezes. She has no rales.  Abdominal: Soft. Bowel sounds are normal. She exhibits no distension and no mass. There is no tenderness. There is no rebound and no guarding.  Musculoskeletal: Normal range of motion. She exhibits no edema.  Lymphadenopathy:    She has no cervical adenopathy.  Neurological: She is alert and oriented to person, place, and  time.  CN grossly intact, station and gait intact  Skin: Skin is warm and dry. No rash noted.  Psychiatric: She has a normal mood and affect. Her behavior is normal. Judgment and thought content normal.  Nursing note and vitals reviewed.  Results for orders placed or performed in visit on 12/04/17  POCT Urinalysis Dipstick (Automated)  Result Value Ref Range   Color, UA gold    Clarity, UA clear    Glucose, UA Negative Negative   Bilirubin, UA 1+    Ketones, UA negative    Spec Grav, UA >=1.030 (A) 1.010 - 1.025   Blood, UA negative    pH, UA 6.0 5.0 - 8.0   Protein, UA Negative Negative   Urobilinogen, UA 0.2 0.2 or 1.0 E.U./dL   Nitrite, UA negative    Leukocytes, UA Negative Negative      Assessment & Plan:   Problem List Items Addressed This Visit    Weight loss    This is stable over the past year  Urinary incontinence    Anticipate urge incontinence, they deny stress symptoms.  Reviewed prompting given memory trouble. Encouraged bathroom routine.  myrbetriq was unaffordable.  Urge may increase fall risk - discussed possible trial antimuscarinic despite increased side effects in dementia - daughter would like to hold off on additional medicine at this time.       Osteoarthritis   Medicare annual wellness visit, subsequent - Primary    I have personally reviewed the Medicare Annual Wellness questionnaire and have noted 1. The patient's medical and social history 2. Their use of alcohol, tobacco or illicit drugs 3. Their current medications and supplements 4. The patient's functional ability including ADL's, fall risks, home safety risks and hearing or visual impairment. Cognitive function has been assessed and addressed as indicated.  5. Diet and physical activity 6. Evidence for depression or mood disorders The patients weight, height, BMI have been recorded in the chart. I have made referrals, counseling and provided education to the patient based on review of  the above and I have provided the pt with a written personalized care plan for preventive services. Provider list updated.. See scanned questionairre as needed for further documentation. Reviewed preventative protocols and updated unless pt declined.       Irritability and anger    Increase sertraline to 100mg  daily, decrease lorazepam to 0.5mg  nightly. Continue aricept and namenda. Monitor for effect.       Hyperglycemia   HLD (hyperlipidemia)    Chronic, off statin. Update labs.       Health maintenance examination    Preventative protocols reviewed and updated unless pt declined. Discussed healthy diet and lifestyle.       Dementia (HCC)    Reviewed regimen to date - continue donepezil and namenda. Continue sertraline. Outpatient palliative care following.       Relevant Medications   sertraline (ZOLOFT) 100 MG tablet   Advanced care planning/counseling discussion    They bring in advanced directive packet for pt and husband which was reviewed. HCPOA are Cora Collum and Gerrianne Scale. Does not want prolonged life support if terminal condition. Does want tube feeds. Wants HCPOA decisions to supercede advanced directive.        Other Visit Diagnoses    Need for influenza vaccination       Relevant Orders   Flu Vaccine QUAD 36+ mos IM (Completed)       Meds ordered this encounter  Medications  . sertraline (ZOLOFT) 100 MG tablet    Sig: Take 1 tablet (100 mg total) by mouth daily.    Dispense:  30 tablet    Refill:  6   Orders Placed This Encounter  Procedures  . Flu Vaccine QUAD 36+ mos IM    Follow up plan: Return in about 3 months (around 05/12/2018) for follow up visit.  Eustaquio Boyden, MD

## 2018-02-09 ENCOUNTER — Encounter: Payer: Self-pay | Admitting: Family Medicine

## 2018-02-09 ENCOUNTER — Telehealth: Payer: Self-pay | Admitting: Radiology

## 2018-02-09 ENCOUNTER — Ambulatory Visit: Payer: Medicare Other

## 2018-02-09 ENCOUNTER — Ambulatory Visit (INDEPENDENT_AMBULATORY_CARE_PROVIDER_SITE_OTHER): Payer: Medicare Other | Admitting: Family Medicine

## 2018-02-09 VITALS — BP 168/84 | HR 56 | Temp 98.4°F | Ht 62.75 in | Wt 120.5 lb

## 2018-02-09 DIAGNOSIS — R32 Unspecified urinary incontinence: Secondary | ICD-10-CM

## 2018-02-09 DIAGNOSIS — R454 Irritability and anger: Secondary | ICD-10-CM

## 2018-02-09 DIAGNOSIS — M150 Primary generalized (osteo)arthritis: Secondary | ICD-10-CM

## 2018-02-09 DIAGNOSIS — E785 Hyperlipidemia, unspecified: Secondary | ICD-10-CM

## 2018-02-09 DIAGNOSIS — Z0001 Encounter for general adult medical examination with abnormal findings: Secondary | ICD-10-CM | POA: Diagnosis not present

## 2018-02-09 DIAGNOSIS — M159 Polyosteoarthritis, unspecified: Secondary | ICD-10-CM

## 2018-02-09 DIAGNOSIS — Z7189 Other specified counseling: Secondary | ICD-10-CM

## 2018-02-09 DIAGNOSIS — R634 Abnormal weight loss: Secondary | ICD-10-CM | POA: Diagnosis not present

## 2018-02-09 DIAGNOSIS — F0391 Unspecified dementia with behavioral disturbance: Secondary | ICD-10-CM

## 2018-02-09 DIAGNOSIS — R739 Hyperglycemia, unspecified: Secondary | ICD-10-CM

## 2018-02-09 DIAGNOSIS — Z23 Encounter for immunization: Secondary | ICD-10-CM | POA: Diagnosis not present

## 2018-02-09 DIAGNOSIS — Z0000 Encounter for general adult medical examination without abnormal findings: Secondary | ICD-10-CM

## 2018-02-09 LAB — CBC WITH DIFFERENTIAL/PLATELET
BASOS PCT: 0.7 % (ref 0.0–3.0)
Basophils Absolute: 0 10*3/uL (ref 0.0–0.1)
EOS PCT: 1.2 % (ref 0.0–5.0)
Eosinophils Absolute: 0.1 10*3/uL (ref 0.0–0.7)
HCT: 41.5 % (ref 36.0–46.0)
HEMOGLOBIN: 13.8 g/dL (ref 12.0–15.0)
LYMPHS ABS: 1.6 10*3/uL (ref 0.7–4.0)
Lymphocytes Relative: 23.8 % (ref 12.0–46.0)
MCHC: 33.3 g/dL (ref 30.0–36.0)
MCV: 90 fl (ref 78.0–100.0)
Monocytes Absolute: 0.7 10*3/uL (ref 0.1–1.0)
Monocytes Relative: 9.8 % (ref 3.0–12.0)
NEUTROS PCT: 64.5 % (ref 43.0–77.0)
Neutro Abs: 4.3 10*3/uL (ref 1.4–7.7)
Platelets: 243 10*3/uL (ref 150.0–400.0)
RBC: 4.61 Mil/uL (ref 3.87–5.11)
RDW: 15 % (ref 11.5–15.5)
WBC: 6.6 10*3/uL (ref 4.0–10.5)

## 2018-02-09 LAB — LIPID PANEL
CHOLESTEROL: 223 mg/dL — AB (ref 0–200)
HDL: 75.3 mg/dL (ref >39.00)
LDL Cholesterol: 132 mg/dL — ABNORMAL HIGH (ref 0–99)
NonHDL: 147.54
TRIGLYCERIDES: 78 mg/dL (ref 0.0–149.0)
Total CHOL/HDL Ratio: 3
VLDL: 15.6 mg/dL (ref 0.0–40.0)

## 2018-02-09 LAB — COMPREHENSIVE METABOLIC PANEL
ALBUMIN: 4.2 g/dL (ref 3.5–5.2)
ALT: 10 U/L (ref 0–35)
AST: 16 U/L (ref 0–37)
Alkaline Phosphatase: 71 U/L (ref 39–117)
BUN: 29 mg/dL — ABNORMAL HIGH (ref 6–23)
CALCIUM: 9.9 mg/dL (ref 8.4–10.5)
CO2: 29 mEq/L (ref 19–32)
CREATININE: 1.06 mg/dL (ref 0.40–1.20)
Chloride: 105 mEq/L (ref 96–112)
GFR: 62.46 mL/min (ref >60.00)
Glucose, Bld: 105 mg/dL — ABNORMAL HIGH (ref 70–99)
Potassium: 4.7 mEq/L (ref 3.5–5.1)
Sodium: 142 mEq/L (ref 135–145)
Total Bilirubin: 0.4 mg/dL (ref 0.2–1.2)
Total Protein: 7.5 g/dL (ref 6.0–8.3)

## 2018-02-09 LAB — HEMOGLOBIN A1C: Hgb A1c MFr Bld: 6 % (ref 4.6–6.5)

## 2018-02-09 LAB — TSH: TSH: 1.02 u[IU]/mL (ref 0.35–4.50)

## 2018-02-09 MED ORDER — SERTRALINE HCL 100 MG PO TABS: 100.0000 mg | ORAL_TABLET | Freq: Every day | ORAL | 6 refills | Status: DC

## 2018-02-09 NOTE — Assessment & Plan Note (Signed)
Anticipate urge incontinence, they deny stress symptoms.  Reviewed prompting given memory trouble. Encouraged bathroom routine.  myrbetriq was unaffordable.  Urge may increase fall risk - discussed possible trial antimuscarinic despite increased side effects in dementia - daughter would like to hold off on additional medicine at this time.

## 2018-02-09 NOTE — Assessment & Plan Note (Signed)
This is stable over the past year

## 2018-02-09 NOTE — Patient Instructions (Addendum)
Flu shot today Labs today.  Have depends available. Work on prompting to go to the bathroom on a schedule throughout the day.  Increase sertraline to '100mg'$  daily. Try to use lorazepam only at bedtime.  Blood pressure was high - take meds right when you get home today.  Return in 3-4 months for follow up visit.   Health Maintenance, Female Adopting a healthy lifestyle and getting preventive care can go a long way to promote health and wellness. Talk with your health care provider about what schedule of regular examinations is right for you. This is a good chance for you to check in with your provider about disease prevention and staying healthy. In between checkups, there are plenty of things you can do on your own. Experts have done a lot of research about which lifestyle changes and preventive measures are most likely to keep you healthy. Ask your health care provider for more information. Weight and diet Eat a healthy diet  Be sure to include plenty of vegetables, fruits, low-fat dairy products, and lean protein.  Do not eat a lot of foods high in solid fats, added sugars, or salt.  Get regular exercise. This is one of the most important things you can do for your health. ? Most adults should exercise for at least 150 minutes each week. The exercise should increase your heart rate and make you sweat (moderate-intensity exercise). ? Most adults should also do strengthening exercises at least twice a week. This is in addition to the moderate-intensity exercise.  Maintain a healthy weight  Body mass index (BMI) is a measurement that can be used to identify possible weight problems. It estimates body fat based on height and weight. Your health care provider can help determine your BMI and help you achieve or maintain a healthy weight.  For females 64 years of age and older: ? A BMI below 18.5 is considered underweight. ? A BMI of 18.5 to 24.9 is normal. ? A BMI of 25 to 29.9 is considered  overweight. ? A BMI of 30 and above is considered obese.  Watch levels of cholesterol and blood lipids  You should start having your blood tested for lipids and cholesterol at 82 years of age, then have this test every 5 years.  You may need to have your cholesterol levels checked more often if: ? Your lipid or cholesterol levels are high. ? You are older than 82 years of age. ? You are at high risk for heart disease.  Cancer screening Lung Cancer  Lung cancer screening is recommended for adults 33-16 years old who are at high risk for lung cancer because of a history of smoking.  A yearly low-dose CT scan of the lungs is recommended for people who: ? Currently smoke. ? Have quit within the past 15 years. ? Have at least a 30-pack-year history of smoking. A pack year is smoking an average of one pack of cigarettes a day for 1 year.  Yearly screening should continue until it has been 15 years since you quit.  Yearly screening should stop if you develop a health problem that would prevent you from having lung cancer treatment.  Breast Cancer  Practice breast self-awareness. This means understanding how your breasts normally appear and feel.  It also means doing regular breast self-exams. Let your health care provider know about any changes, no matter how small.  If you are in your 20s or 30s, you should have a clinical breast exam (CBE) by  a health care provider every 1-3 years as part of a regular health exam.  If you are 53 or older, have a CBE every year. Also consider having a breast X-ray (mammogram) every year.  If you have a family history of breast cancer, talk to your health care provider about genetic screening.  If you are at high risk for breast cancer, talk to your health care provider about having an MRI and a mammogram every year.  Breast cancer gene (BRCA) assessment is recommended for women who have family members with BRCA-related cancers. BRCA-related cancers  include: ? Breast. ? Ovarian. ? Tubal. ? Peritoneal cancers.  Results of the assessment will determine the need for genetic counseling and BRCA1 and BRCA2 testing.  Cervical Cancer Your health care provider may recommend that you be screened regularly for cancer of the pelvic organs (ovaries, uterus, and vagina). This screening involves a pelvic examination, including checking for microscopic changes to the surface of your cervix (Pap test). You may be encouraged to have this screening done every 3 years, beginning at age 77.  For women ages 67-65, health care providers may recommend pelvic exams and Pap testing every 3 years, or they may recommend the Pap and pelvic exam, combined with testing for human papilloma virus (HPV), every 5 years. Some types of HPV increase your risk of cervical cancer. Testing for HPV may also be done on women of any age with unclear Pap test results.  Other health care providers may not recommend any screening for nonpregnant women who are considered low risk for pelvic cancer and who do not have symptoms. Ask your health care provider if a screening pelvic exam is right for you.  If you have had past treatment for cervical cancer or a condition that could lead to cancer, you need Pap tests and screening for cancer for at least 20 years after your treatment. If Pap tests have been discontinued, your risk factors (such as having a new sexual partner) need to be reassessed to determine if screening should resume. Some women have medical problems that increase the chance of getting cervical cancer. In these cases, your health care provider may recommend more frequent screening and Pap tests.  Colorectal Cancer  This type of cancer can be detected and often prevented.  Routine colorectal cancer screening usually begins at 82 years of age and continues through 82 years of age.  Your health care provider may recommend screening at an earlier age if you have risk factors  for colon cancer.  Your health care provider may also recommend using home test kits to check for hidden blood in the stool.  A small camera at the end of a tube can be used to examine your colon directly (sigmoidoscopy or colonoscopy). This is done to check for the earliest forms of colorectal cancer.  Routine screening usually begins at age 76.  Direct examination of the colon should be repeated every 5-10 years through 82 years of age. However, you may need to be screened more often if early forms of precancerous polyps or small growths are found.  Skin Cancer  Check your skin from head to toe regularly.  Tell your health care provider about any new moles or changes in moles, especially if there is a change in a mole's shape or color.  Also tell your health care provider if you have a mole that is larger than the size of a pencil eraser.  Always use sunscreen. Apply sunscreen liberally and repeatedly  throughout the day.  Protect yourself by wearing long sleeves, pants, a wide-brimmed hat, and sunglasses whenever you are outside.  Heart disease, diabetes, and high blood pressure  High blood pressure causes heart disease and increases the risk of stroke. High blood pressure is more likely to develop in: ? People who have blood pressure in the high end of the normal range (130-139/85-89 mm Hg). ? People who are overweight or obese. ? People who are African American.  If you are 82-37 years of age, have your blood pressure checked every 3-5 years. If you are 52 years of age or older, have your blood pressure checked every year. You should have your blood pressure measured twice-once when you are at a hospital or clinic, and once when you are not at a hospital or clinic. Record the average of the two measurements. To check your blood pressure when you are not at a hospital or clinic, you can use: ? An automated blood pressure machine at a pharmacy. ? A home blood pressure monitor.  If  you are between 25 years and 33 years old, ask your health care provider if you should take aspirin to prevent strokes.  Have regular diabetes screenings. This involves taking a blood sample to check your fasting blood sugar level. ? If you are at a normal weight and have a low risk for diabetes, have this test once every three years after 82 years of age. ? If you are overweight and have a high risk for diabetes, consider being tested at a younger age or more often. Preventing infection Hepatitis B  If you have a higher risk for hepatitis B, you should be screened for this virus. You are considered at high risk for hepatitis B if: ? You were born in a country where hepatitis B is common. Ask your health care provider which countries are considered high risk. ? Your parents were born in a high-risk country, and you have not been immunized against hepatitis B (hepatitis B vaccine). ? You have HIV or AIDS. ? You use needles to inject street drugs. ? You live with someone who has hepatitis B. ? You have had sex with someone who has hepatitis B. ? You get hemodialysis treatment. ? You take certain medicines for conditions, including cancer, organ transplantation, and autoimmune conditions.  Hepatitis C  Blood testing is recommended for: ? Everyone born from [birth year] through 1963. ? Anyone with known risk factors for hepatitis C.  Sexually transmitted infections (STIs)  You should be screened for sexually transmitted infections (STIs) including gonorrhea and chlamydia if: ? You are sexually active and are younger than 82 years of age. ? You are older than 82 years of age and your health care provider tells you that you are at risk for this type of infection. ? Your sexual activity has changed since you were last screened and you are at an increased risk for chlamydia or gonorrhea. Ask your health care provider if you are at risk.  If you do not have HIV, but are at risk, it may be recommended  that you take a prescription medicine daily to prevent HIV infection. This is called pre-exposure prophylaxis (PrEP). You are considered at risk if: ? You are sexually active and do not regularly use condoms or know the HIV status of your partner(s). ? You take drugs by injection. ? You are sexually active with a partner who has HIV.  Talk with your health care provider about whether you are  at high risk of being infected with HIV. If you choose to begin PrEP, you should first be tested for HIV. You should then be tested every 3 months for as long as you are taking PrEP. Pregnancy  If you are premenopausal and you may become pregnant, ask your health care provider about preconception counseling.  If you may become pregnant, take 400 to 800 micrograms (mcg) of folic acid every day.  If you want to prevent pregnancy, talk to your health care provider about birth control (contraception). Osteoporosis and menopause  Osteoporosis is a disease in which the bones lose minerals and strength with aging. This can result in serious bone fractures. Your risk for osteoporosis can be identified using a bone density scan.  If you are 30 years of age or older, or if you are at risk for osteoporosis and fractures, ask your health care provider if you should be screened.  Ask your health care provider whether you should take a calcium or vitamin D supplement to lower your risk for osteoporosis.  Menopause may have certain physical symptoms and risks.  Hormone replacement therapy may reduce some of these symptoms and risks. Talk to your health care provider about whether hormone replacement therapy is right for you. Follow these instructions at home:  Schedule regular health, dental, and eye exams.  Stay current with your immunizations.  Do not use any tobacco products including cigarettes, chewing tobacco, or electronic cigarettes.  If you are pregnant, do not drink alcohol.  If you are  breastfeeding, limit how much and how often you drink alcohol.  Limit alcohol intake to no more than 1 drink per day for nonpregnant women. One drink equals 12 ounces of beer, 5 ounces of wine, or 1 ounces of hard liquor.  Do not use street drugs.  Do not share needles.  Ask your health care provider for help if you need support or information about quitting drugs.  Tell your health care provider if you often feel depressed.  Tell your health care provider if you have ever been abused or do not feel safe at home. This information is not intended to replace advice given to you by your health care provider. Make sure you discuss any questions you have with your health care provider. Document Released: 10/25/2010 Document Revised: 09/17/2015 Document Reviewed: 01/13/2015 Elsevier Interactive Patient Education  [employer].

## 2018-02-09 NOTE — Assessment & Plan Note (Signed)

## 2018-02-09 NOTE — Telephone Encounter (Signed)
Pt was notified at [facility] today.

## 2018-02-09 NOTE — Assessment & Plan Note (Signed)
They bring in advanced directive packet for pt and husband which was reviewed. HCPOA are Cora Collum and Gerrianne Scale. Does not want prolonged life support if terminal condition. Does want tube feeds. Wants HCPOA decisions to supercede advanced directive.

## 2018-02-09 NOTE — Assessment & Plan Note (Signed)
Reviewed regimen to date - continue donepezil and namenda. Continue sertraline. Outpatient palliative care following.

## 2018-02-09 NOTE — Assessment & Plan Note (Signed)
Chronic, off statin. Update labs.

## 2018-02-09 NOTE — Assessment & Plan Note (Signed)
Increase sertraline to 100mg  daily, decrease lorazepam to 0.5mg  nightly. Continue aricept and namenda. Monitor for effect.

## 2018-02-10 ENCOUNTER — Encounter: Payer: Self-pay | Admitting: Family Medicine

## 2018-02-12 ENCOUNTER — Ambulatory Visit: Payer: Medicare Other | Admitting: Family Medicine

## 2018-02-12 NOTE — Telephone Encounter (Signed)
Opened in error

## 2018-02-28 ENCOUNTER — Other Ambulatory Visit: Payer: Self-pay | Admitting: Family Medicine

## 2018-02-28 ENCOUNTER — Telehealth: Payer: Self-pay | Admitting: Family Medicine

## 2018-02-28 NOTE — Telephone Encounter (Signed)
°. ° ° °  1. Which medications need to be refilled? (please list name of each medication and dose if known) Metoprolol 25 mg  2. Which pharmacy/location (including street and city if local pharmacy) is medication to be sent to? [facility]  3. Do they need a 30 day or 90 day supply? 90 day

## 2018-02-28 NOTE — Telephone Encounter (Signed)
Escribed

## 2018-03-15 ENCOUNTER — Other Ambulatory Visit: Payer: Self-pay | Admitting: Family Medicine

## 2018-03-15 NOTE — Telephone Encounter (Signed)
Name of Medication:  Lorazepam Name of Pharmacy: [facility] Last Fill or Written Date and Quantity: 02/08/18, #40/0 Last Office Visit and Type:  02/09/18, CPE Next Office Visit and Type:  05/15/18, 3 mo f/u Last Controlled Substance Agreement Date: none Last UDS: none

## 2018-03-16 ENCOUNTER — Other Ambulatory Visit: Payer: Self-pay

## 2018-03-16 ENCOUNTER — Observation Stay
Admission: EM | Admit: 2018-03-16 | Discharge: 2018-03-17 | Disposition: A | Discharge status: Home Health | Payer: Medicare Other | Attending: Internal Medicine | Admitting: Internal Medicine

## 2018-03-16 ENCOUNTER — Emergency Department: Payer: Medicare Other

## 2018-03-16 DIAGNOSIS — R778 Other specified abnormalities of plasma proteins: Secondary | ICD-10-CM | POA: Diagnosis present

## 2018-03-16 DIAGNOSIS — W19XXXA Unspecified fall, initial encounter: Secondary | ICD-10-CM | POA: Insufficient documentation

## 2018-03-16 DIAGNOSIS — Z79899 Other long term (current) drug therapy: Secondary | ICD-10-CM | POA: Insufficient documentation

## 2018-03-16 DIAGNOSIS — R55 Syncope and collapse: Secondary | ICD-10-CM | POA: Diagnosis not present

## 2018-03-16 DIAGNOSIS — R748 Abnormal levels of other serum enzymes: Secondary | ICD-10-CM | POA: Insufficient documentation

## 2018-03-16 DIAGNOSIS — N3000 Acute cystitis without hematuria: Secondary | ICD-10-CM | POA: Diagnosis not present

## 2018-03-16 DIAGNOSIS — Z66 Do not resuscitate: Secondary | ICD-10-CM | POA: Insufficient documentation

## 2018-03-16 DIAGNOSIS — Z85038 Personal history of other malignant neoplasm of large intestine: Secondary | ICD-10-CM | POA: Diagnosis not present

## 2018-03-16 DIAGNOSIS — S0083XA Contusion of other part of head, initial encounter: Secondary | ICD-10-CM | POA: Insufficient documentation

## 2018-03-16 DIAGNOSIS — S52572A Other intraarticular fracture of lower end of left radius, initial encounter for closed fracture: Secondary | ICD-10-CM | POA: Insufficient documentation

## 2018-03-16 DIAGNOSIS — F0390 Unspecified dementia without behavioral disturbance: Secondary | ICD-10-CM | POA: Diagnosis not present

## 2018-03-16 DIAGNOSIS — Z87891 Personal history of nicotine dependence: Secondary | ICD-10-CM | POA: Diagnosis not present

## 2018-03-16 DIAGNOSIS — I10 Essential (primary) hypertension: Secondary | ICD-10-CM | POA: Insufficient documentation

## 2018-03-16 DIAGNOSIS — R7989 Other specified abnormal findings of blood chemistry: Secondary | ICD-10-CM | POA: Diagnosis present

## 2018-03-16 LAB — CBC WITH DIFFERENTIAL/PLATELET
Abs Immature Granulocytes: 0.11 10*3/uL — ABNORMAL HIGH (ref 0.00–0.07)
BASOS PCT: 0 %
Basophils Absolute: 0.1 10*3/uL (ref 0.0–0.1)
EOS ABS: 0.1 10*3/uL (ref 0.0–0.5)
EOS PCT: 1 %
HCT: 40.1 % (ref 36.0–46.0)
Hemoglobin: 13.1 g/dL (ref 12.0–15.0)
IMMATURE GRANULOCYTES: 1 %
Lymphocytes Relative: 5 %
Lymphs Abs: 0.7 10*3/uL (ref 0.7–4.0)
MCH: 29.4 pg (ref 26.0–34.0)
MCHC: 32.7 g/dL (ref 30.0–36.0)
MCV: 90.1 fL (ref 80.0–100.0)
Monocytes Absolute: 1 10*3/uL (ref 0.1–1.0)
Monocytes Relative: 7 %
NRBC: 0 % (ref 0.0–0.2)
Neutro Abs: 12.7 10*3/uL — ABNORMAL HIGH (ref 1.7–7.7)
Neutrophils Relative %: 86 %
PLATELETS: 238 10*3/uL (ref 150–400)
RBC: 4.45 MIL/uL (ref 3.87–5.11)
RDW: 14.4 % (ref 11.5–15.5)
WBC: 14.6 10*3/uL — AB (ref 4.0–10.5)

## 2018-03-16 LAB — TROPONIN I
TROPONIN I: 0.03 ng/mL — AB (ref <0.03)
TROPONIN I: 0.05 ng/mL — AB (ref <0.03)
TROPONIN I: 0.05 ng/mL — AB (ref <0.03)
Troponin I: 0.06 ng/mL (ref <0.03)

## 2018-03-16 LAB — URINALYSIS, COMPLETE (UACMP) WITH MICROSCOPIC
Bilirubin Urine: NEGATIVE
Glucose, UA: NEGATIVE mg/dL
Ketones, ur: NEGATIVE mg/dL
Nitrite: NEGATIVE
PH: 6 (ref 5.0–8.0)
Protein, ur: 30 mg/dL — AB
SPECIFIC GRAVITY, URINE: 1.01 (ref 1.005–1.030)

## 2018-03-16 LAB — COMPREHENSIVE METABOLIC PANEL
ALT: 18 U/L (ref 0–44)
ANION GAP: 13 (ref 5–15)
AST: 35 U/L (ref 15–41)
Albumin: 4.5 g/dL (ref 3.5–5.0)
Alkaline Phosphatase: 72 U/L (ref 38–126)
BILIRUBIN TOTAL: 0.9 mg/dL (ref 0.3–1.2)
BUN: 41 mg/dL — AB (ref 8–23)
CHLORIDE: 107 mmol/L (ref 98–111)
CO2: 24 mmol/L (ref 22–32)
Calcium: 9.8 mg/dL (ref 8.9–10.3)
Creatinine, Ser: 1.43 mg/dL — ABNORMAL HIGH (ref 0.44–1.00)
GFR calc Af Amer: 36 mL/min — ABNORMAL LOW (ref >60)
GFR calc non Af Amer: 31 mL/min — ABNORMAL LOW (ref >60)
GLUCOSE: 135 mg/dL — AB (ref 70–99)
POTASSIUM: 4.3 mmol/L (ref 3.5–5.1)
Sodium: 144 mmol/L (ref 135–145)
TOTAL PROTEIN: 7.9 g/dL (ref 6.5–8.1)

## 2018-03-16 LAB — TYPE AND SCREEN
ABO/RH(D): A POS
ANTIBODY SCREEN: NEGATIVE

## 2018-03-16 MED ORDER — POLYETHYLENE GLYCOL 3350 17 G PO PACK: 17.0000 g | PACK | Freq: Every day | ORAL | Status: DC | PRN

## 2018-03-16 MED ORDER — SERTRALINE HCL 50 MG PO TABS
100.0000 mg | ORAL_TABLET | Freq: Every day | ORAL | Status: DC
  Administered 2018-03-17: 100 mg via ORAL
  Filled 2018-03-16: qty 2

## 2018-03-16 MED ORDER — SODIUM CHLORIDE 0.9 % IV SOLN
INTRAVENOUS | Status: DC
  Administered 2018-03-16: 15:00:00 via INTRAVENOUS

## 2018-03-16 MED ORDER — MEMANTINE HCL 5 MG PO TABS
10.0000 mg | ORAL_TABLET | Freq: Every day | ORAL | Status: DC
  Administered 2018-03-17: 10 mg via ORAL
  Filled 2018-03-16: qty 2

## 2018-03-16 MED ORDER — ONDANSETRON HCL 4 MG PO TABS: 4.0000 mg | ORAL_TABLET | Freq: Four times a day (QID) | ORAL | Status: DC | PRN

## 2018-03-16 MED ORDER — OXYCODONE HCL 5 MG PO TABS
5.0000 mg | ORAL_TABLET | ORAL | Status: DC | PRN
  Administered 2018-03-16: 5 mg via ORAL
  Filled 2018-03-16: qty 1

## 2018-03-16 MED ORDER — ACETAMINOPHEN 650 MG RE SUPP: 650.0000 mg | Freq: Four times a day (QID) | RECTAL | Status: DC | PRN

## 2018-03-16 MED ORDER — ONDANSETRON HCL 4 MG/2ML IJ SOLN: 4.0000 mg | Freq: Four times a day (QID) | INTRAMUSCULAR | Status: DC | PRN

## 2018-03-16 MED ORDER — AMLODIPINE BESYLATE 5 MG PO TABS
10.0000 mg | ORAL_TABLET | Freq: Every day | ORAL | Status: DC
  Administered 2018-03-17: 10 mg via ORAL
  Filled 2018-03-16: qty 2

## 2018-03-16 MED ORDER — SODIUM CHLORIDE 0.9 % IV SOLN
1.0000 g | INTRAVENOUS | Status: DC
  Administered 2018-03-17: 1 g via INTRAVENOUS
  Filled 2018-03-16: qty 10
  Filled 2018-03-16: qty 1

## 2018-03-16 MED ORDER — DONEPEZIL HCL 5 MG PO TABS
10.0000 mg | ORAL_TABLET | Freq: Every day | ORAL | Status: DC
  Administered 2018-03-16: 10 mg via ORAL
  Filled 2018-03-16 (×2): qty 2

## 2018-03-16 MED ORDER — HEPARIN SODIUM (PORCINE) 5000 UNIT/ML IJ SOLN
5000.0000 [IU] | Freq: Three times a day (TID) | INTRAMUSCULAR | Status: DC
  Administered 2018-03-16 – 2018-03-17 (×3): 5000 [IU] via SUBCUTANEOUS
  Filled 2018-03-16 (×3): qty 1

## 2018-03-16 MED ORDER — LORAZEPAM 0.5 MG PO TABS
0.2500 mg | ORAL_TABLET | Freq: Two times a day (BID) | ORAL | Status: DC | PRN
  Administered 2018-03-16: 0.25 mg via ORAL
  Filled 2018-03-16: qty 1

## 2018-03-16 MED ORDER — OXYCODONE HCL 5 MG PO TABS
5.0000 mg | ORAL_TABLET | ORAL | Status: DC | PRN
  Administered 2018-03-17: 5 mg via ORAL
  Filled 2018-03-16 (×2): qty 1

## 2018-03-16 MED ORDER — METOPROLOL SUCCINATE ER 25 MG PO TB24
12.5000 mg | ORAL_TABLET | Freq: Every day | ORAL | Status: DC
  Administered 2018-03-17: 12.5 mg via ORAL
  Filled 2018-03-16: qty 1

## 2018-03-16 MED ORDER — HYDROMORPHONE HCL 1 MG/ML IJ SOLN
0.5000 mg | Freq: Once | INTRAMUSCULAR | Status: AC
  Administered 2018-03-16: 0.5 mg via INTRAVENOUS
  Filled 2018-03-16: qty 1

## 2018-03-16 MED ORDER — ACETAMINOPHEN 325 MG PO TABS
650.0000 mg | ORAL_TABLET | Freq: Four times a day (QID) | ORAL | Status: DC | PRN
  Administered 2018-03-16: 650 mg via ORAL
  Filled 2018-03-16: qty 2

## 2018-03-16 NOTE — Progress Notes (Signed)
Family Meeting Note  Advance Directive:no  Today a meeting took place with the spouse adn family.  Patient is unable to participate due ZO:XWRUEAto:Lacked capacity severe dementia   The following clinical team members were present during this meeting:MD RN  The following were discussed:Patient's diagnosis:dementia Patient's progosis: Unable to determine and Goals for treatment: DNR  Additional follow-up to be provided: Patient with severe dementia and frail appearing. Family would like to have patient CODE STATUS as DNR. Patient will need outpatient palliative care services upon discharge Chaplain consultation to start advanced directives  Time spent during discussion: 17 minutes  Azion Centrella, MD

## 2018-03-16 NOTE — ED Triage Notes (Signed)
[facility]. She is coming from home. Bystanders found her laying on the ground outside of her neighbor's home. Pt does not remember going outside this morning. She fell onto her knees and caught herself with bilateral wrists. Head was also struck in the fall. She has swelling and bruising to both wrists (more significant in left wrist). Swelling and bruising to bilateral knees. Skin tear noted to left forearm. Large hematoma noted to left forehead. Bruising noted over left eye. Pt is A&Ox3 on arrival.

## 2018-03-16 NOTE — Evaluation (Signed)
Physical Therapy Evaluation Patient Details Name: Erika Weiss MRN: [id number] DOB: 1924/08/03 Today's Date: 03/16/2018   History of Present Illness  Erika Weiss is a 84yo female who comes to [facility] after found down in yard by [employer]. Pt sustained facial trauma, bruises to knees, and Left wrist fracture. PMH: dementia.   Clinical Impression  Pt admitted with above diagnosis. Pt currently with functional limitations due to the deficits listed below (see "PT Problem List"). Upon entry, pt in bed, patient's daughter and husband in room. The pt is awake and agreeable to participate. Orthostatic BP negative. The pt is alert and oriented x3, pleasant, conversational, and following simple commands consistently- noted short term memory deficits as redundant instances of novelty recur in session. Pt mobilizes to EOB with good trunk strength, some supervision and cuing needed to deter LUE weight bearing. Multiple attempts at STS transfers, all with retropulsion and unexpected instability, requiring mod-max A for sustained standing. Pt not safe for AMB attempts at this time, but able to take some side steps with SPC at bedside with maxA physical assist. Functional mobility assessment demonstrates increased effort/time requirements, poor tolerance, and need for physical assistance, whereas the patient performed these at a higher level of independence PTA. Pt's husband is not physically able to provide required physical assistance at home and he also has some age-related cognitive impairment per daughter. Pt is typically quite mobile and without gait instability at baseline and pt's husband and daughter both agree it appropriate for patient to undergo STR in a SNF setting prior to return to home, as support will be limited. Pt will benefit from skilled PT intervention to increase independence and safety with basic mobility in preparation for discharge to the venue listed below.       Follow Up Recommendations  SNF;Supervision/Assistance - 24 hour    Equipment Recommendations  None recommended by PT    Recommendations for Other Services       Precautions / Restrictions Precautions Precautions: Fall Restrictions LUE Weight Bearing: Non weight bearing      Mobility  Bed Mobility Overal bed mobility: Needs Assistance Bed Mobility: Supine to Sit;Sit to Supine     Supine to sit: Supervision;Min guard Sit to supine: Min assist   General bed mobility comments: additional effort required and tactile cues to avoid RUE use  Transfers Overall transfer level: Needs assistance Equipment used: Straight cane;None Transfers: Sit to/from Stand Sit to Stand: Min assist;Mod assist         General transfer comment: performed several times, pt with retropulsion, difficulty establishing balance in stance, also reports soreness on bilat knees.   Ambulation/Gait Ambulation/Gait assistance: Max assist(unsafe to attempt at this time. )           General Gait Details: Pt able to take 6-10 steps Right side stepping at EOB toward center of bed with SPC in hand and maxA for support and cues. Requires about 2-3 minutes  Stairs            Wheelchair Mobility    Modified Rankin (Stroke Patients Only)       Balance Overall balance assessment: Needs assistance;History of Falls Sitting-balance support: Feet supported;No upper extremity supported Sitting balance-Leahy Scale: Good   Postural control: Posterior lean Standing balance support: During functional activity;Bilateral upper extremity supported Standing balance-Leahy Scale: Zero  Pertinent Vitals/Pain Pain Assessment: No/denies pain    Home Living Family/patient expects to be discharged to:: Private residence Living Arrangements: Spouse/significant other Available Help at Discharge: Family Type of Home: House Home Access: Stairs to enter   [employer] Corporation[employer]trance Stairs-Number of Steps: 1-2 steps  back (1 rail), 3-4 steps (no rails ) Home Layout: One level Home Equipment: Cane - single point;Walker - 2 wheels(DTR reports patient is non compliant with SPC )      Prior Function Level of Independence: Independent with assistive device(s)         Comments: mostly household AMB; dtr helps with meals      Hand Dominance   Dominant Hand: Right    Extremity/Trunk Assessment   Upper Extremity Assessment Upper Extremity Assessment: Generalized weakness    Lower Extremity Assessment Lower Extremity Assessment: Generalized weakness    Cervical / Trunk Assessment Cervical / Trunk Assessment: (posterior lean seated and when standing)  Communication   Communication: No difficulties  Cognition Arousal/Alertness: Awake/alert Behavior During Therapy: WFL for tasks assessed/performed Overall Cognitive Status: Within Functional Limits for tasks assessed                                        General Comments      Exercises     Assessment/Plan    PT Assessment Patient needs continued PT services  PT Problem List Decreased strength;Decreased range of motion;Decreased balance;Decreased activity tolerance;Decreased mobility       PT Treatment Interventions DME instruction;Gait training;Therapeutic activities;Functional mobility training;Balance training;Patient/family education;Stair training;Therapeutic exercise;Cognitive remediation    PT Goals (Current goals can be found in the Care Plan section)  Acute Rehab PT Goals Patient Stated Goal: regain balance and confidence in standing  PT Goal Formulation: With patient Time For Goal Achievement: 03/30/18 Potential to Achieve Goals: Good    Frequency 7X/week   Barriers to discharge Decreased caregiver support      Co-evaluation               AM-PAC PT "6 Clicks" Mobility  Outcome Measure Help needed turning from your back to your side while in a flat bed without using bedrails?: A Lot Help  needed moving from lying on your back to sitting on the side of a flat bed without using bedrails?: Total Help needed moving to and from a bed to a chair (including a wheelchair)?: Total Help needed standing up from a chair using your arms (e.g., wheelchair or bedside chair)?: Total Help needed to walk in hospital room?: Total Help needed climbing 3-5 steps with a railing? : Total 6 Click Score: 7    End of Session   Activity Tolerance: Patient tolerated treatment well;Patient limited by fatigue Patient left: in bed;with call bell/phone within reach;with bed alarm set;with family/visitor present Nurse Communication: Mobility status PT Visit Diagnosis: Unsteadiness on feet (R26.81);Muscle weakness (generalized) (M62.81);History of falling (Z91.81);Difficulty in walking, not elsewhere classified (R26.2)    Time: [id number] PT Time Calculation (min) (ACUTE ONLY): 33 min   Charges:   PT Evaluation $PT Eval Moderate Complexity: 1 Mod PT Treatments $Therapeutic Activity: 8-22 mins        9:55 PM, 03/16/18 Rosamaria Lints C , PT, DPT Physical Therapist - [facility]  [phone] (ASCOM)     , C 03/16/2018, 9:50 PM

## 2018-03-16 NOTE — ED Provider Notes (Signed)
[facility] Emergency Department Provider Note    First MD Initiated Contact with Patient 03/16/18 [phone]     (approximate)  I have reviewed the triage vital signs and the nursing notes.   HISTORY  Chief Complaint Fall    HPI Erika Weiss is a 84 y.o. female presents to the ER for evaluation of fall.  Patient found by neighbors that she been walking back from her neighbor's house.  Was found lying down on the sidewalk.  Is obvious contusion and swelling to the left forehead.  Also complaining of bilateral wrist pain and knee pain.  Does not remember the nature of the fall.  Is not on any blood thinners.  Lives at home with husband.    Past Medical History:  Diagnosis Date  . Finger wound, simple, open, subsequent encounter 01/24/2017  . Heart murmur new 2017?  Marland Kitchen. History of colon cancer   . HLD (hyperlipidemia)   . Hyperglycemia   . Hypertension   . Osteoarthritis    knees  . Varicose veins of lower extremities with other complications    Family History  Problem Relation Age of Onset  . Cancer Father        colon  . Cancer Sister        breast  . Cancer Brother        pancreas  . Cancer Brother        colon  . Diabetes Sister   . Diabetes Other        nephews  . Heart disease Sister   . Stroke Mother   . Hypertension Mother   . Hypertension Other        multiple  . CAD Neg Hx    Past Surgical History:  Procedure Laterality Date  . ABDOMINAL HYSTERECTOMY  remote   fibroids  . COLON SURGERY  1999   colon cancer  . COLONOSCOPY  12/2008   TAx1 Evette Cristal(Sankar)  . EYE SURGERY  2010  . INGUINAL HERNIA REPAIR  2001  . VEIN SURGERY Left 2008   Patient Active Problem List   Diagnosis Date Noted  . Health maintenance examination 02/08/2018  . Urinary incontinence 12/09/2017  . Sore on toe 09/30/2017  . Imbalance 09/30/2017  . Medicare annual wellness visit, subsequent 01/27/2017  . Advanced care planning/counseling discussion 01/27/2017    . Injury of left knee 08/23/2016  . Weight loss 07/10/2016  . Irritability and anger 10/21/2015  . Dementia (HCC) 09/22/2015  . Systolic murmur 09/22/2015  . Hypertension   . HLD (hyperlipidemia)   . Prediabetes   . Osteoarthritis   . History of colon cancer 01/07/2013  . Varicose veins of lower extremities with other complications 01/07/2013      Prior to Admission medications   Medication Sig Start Date End Date Taking? Authorizing Provider  amLODipine (NORVASC) 10 MG tablet TAKE 1 TABLET DAILY 01/22/18   Eustaquio BoydenGutierrez, Javier, MD  cyanocobalamin 500 MCG tablet Take 500 mcg by mouth daily.    [provider]  donepezil (ARICEPT) 10 MG tablet TAKE 1 TABLET AT BEDTIME 02/05/18   Eustaquio BoydenGutierrez, Javier, MD  LORazepam (ATIVAN) 0.5 MG tablet TAKE 1/2 TO 1 TABLET BY MOUTH TWICE DAILY AS NEEDED FOR ANXIETY 02/08/18   Eustaquio BoydenGutierrez, Javier, MD  magnesium hydroxide (MILK OF MAGNESIA) 400 MG/5ML suspension Take by mouth as needed for mild constipation. 07/08/16   Eustaquio BoydenGutierrez, Javier, MD  memantine [facility](NAMENDA) 10 MG tablet TAKE 1 TABLET DAILY 08/22/17   Eustaquio BoydenGutierrez, Javier,  MD  metoprolol succinate (TOPROL-XL) 25 MG 24 hr tablet TAKE 2 TABLETS BY MOUTH DAILY, TAKE WITH OR IMMEDIATELY FOLLOWING A MEAL (NOTE NEW DIRECTIONS) 02/28/18   Eustaquio Boyden, MD  naproxen sodium (ANAPROX) 220 MG tablet Take 220 mg by mouth as needed.    [provider]  sertraline (ZOLOFT) 100 MG tablet Take 1 tablet (100 mg total) by mouth daily. 02/09/18   Eustaquio Boyden, MD    Allergies Patient has no known allergies.    Social History Social History   Tobacco Use  . Smoking status: Former Smoker    Last attempt to quit: 04/25/1978    Years since quitting: 39.9  . Smokeless tobacco: Never Used  Substance Use Topics  . Alcohol use: No    Alcohol/week: 0.0 standard drinks  . Drug use: No    Review of Systems Patient denies headaches, rhinorrhea, blurry vision, numbness, shortness of breath, chest pain,  edema, cough, abdominal pain, nausea, vomiting, diarrhea, dysuria, fevers, rashes or hallucinations unless otherwise stated above in HPI. ____________________________________________   PHYSICAL EXAM:  VITAL SIGNS: Vitals:   03/16/18 1200 03/16/18 1230  BP: (!) 169/85 (!) 160/98  Pulse: 61 67  Resp: (!) 22 (!) 25  Temp:    SpO2: 98% 99%    Constitutional: Alert,pleasant Eyes: Conjunctivae are normal. Left periorbital ecchymosis.  No proptosis  Head: large contusion and superficial abrasion to left forehead,   Nose: No congestion/rhinnorhea. Mouth/Throat: Mucous membranes are moist.   Neck: No stridor. Painless ROM.  Cardiovascular: Normal rate, regular rhythm. Grossly normal heart sounds.  Good peripheral circulation. Respiratory: Normal respiratory effort.  No retractions. Lungs CTAB. Gastrointestinal: Soft and nontender. No distention. No abdominal bruits. No CVA tenderness. Genitourinary:  Musculoskeletal: contusion and swelling to bilateral wrists and thenar emenince.  Bilateral contusion arasion to anterior knee with swelling. Neurologic:   No gross focal neurologic deficits are appreciated. No facial droop Skin:  Skin is warm, dry and intact. No rash noted. Psychiatric: Mood and affect are normal. Speech and behavior are normal.  ____________________________________________   LABS (all labs ordered are listed, but only abnormal results are displayed)  Results for orders placed or performed during the hospital encounter of 03/16/18 (from the past 24 hour(s))  CBC with Differential/Platelet     Status: Abnormal   Collection Time: 03/16/18  8:52 AM  Result Value Ref Range   WBC 14.6 (H) 4.0 - 10.5 K/uL   RBC 4.45 3.87 - 5.11 MIL/uL   Hemoglobin 13.1 12.0 - 15.0 g/dL   HCT 16.1 09.6 - 04.5 %   MCV 90.1 80.0 - 100.0 fL   MCH 29.4 26.0 - 34.0 pg   MCHC 32.7 30.0 - 36.0 g/dL   RDW 40.9 81.1 - 91.4 %   Platelets 238 150 - 400 K/uL   nRBC 0.0 0.0 - 0.2 %   Neutrophils  Relative % 86 %   Neutro Abs 12.7 (H) 1.7 - 7.7 K/uL   Lymphocytes Relative 5 %   Lymphs Abs 0.7 0.7 - 4.0 K/uL   Monocytes Relative 7 %   Monocytes Absolute 1.0 0.1 - 1.0 K/uL   Eosinophils Relative 1 %   Eosinophils Absolute 0.1 0.0 - 0.5 K/uL   Basophils Relative 0 %   Basophils Absolute 0.1 0.0 - 0.1 K/uL   Immature Granulocytes 1 %   Abs Immature Granulocytes 0.11 (H) 0.00 - 0.07 K/uL  Comprehensive metabolic panel     Status: Abnormal   Collection Time: 03/16/18  8:52  AM  Result Value Ref Range   Sodium 144 135 - 145 mmol/L   Potassium 4.3 3.5 - 5.1 mmol/L   Chloride 107 98 - 111 mmol/L   CO2 24 22 - 32 mmol/L   Glucose, Bld 135 (H) 70 - 99 mg/dL   BUN 41 (H) 8 - 23 mg/dL   Creatinine, Ser 1.61 (H) 0.44 - 1.00 mg/dL   Calcium 9.8 8.9 - 09.6 mg/dL   Total Protein 7.9 6.5 - 8.1 g/dL   Albumin 4.5 3.5 - 5.0 g/dL   AST 35 15 - 41 U/L   ALT 18 0 - 44 U/L   Alkaline Phosphatase 72 38 - 126 U/L   Total Bilirubin 0.9 0.3 - 1.2 mg/dL   GFR calc non Af Amer 31 (L) >60 mL/min   GFR calc Af Amer 36 (L) >60 mL/min   Anion gap 13 5 - 15  Troponin I - ONCE - STAT     Status: Abnormal   Collection Time: 03/16/18  8:52 AM  Result Value Ref Range   Troponin I 0.03 (HH) <0.03 ng/mL  Type and screen Ordered by PROVIDER DEFAULT     Status: None   Collection Time: 03/16/18 10:22 AM  Result Value Ref Range   ABO/RH(D) A POS    Antibody Screen NEG    Sample Expiration      03/19/2018 Performed at [facility], [address] [place], [place] [address]   Troponin I - Once-Timed     Status: Abnormal   Collection Time: 03/16/18 11:23 AM  Result Value Ref Range   Troponin I 0.05 (HH) <0.03 ng/mL   ____________________________________________  EKG My review and personal interpretation at Time: 9:18   Indication: fall  Rate: 60  Rhythm: sinus Axis: normal Other: baseline artifact, no stemi ____________________________________________  RADIOLOGY  I personally reviewed  all radiographic images ordered to evaluate for the above acute complaints and reviewed radiology reports and findings.  These findings were personally discussed with the patient.  Please see medical record for radiology report.  ____________________________________________   PROCEDURES  Procedure(s) performed:  Procedures    Critical Care performed: no ____________________________________________   INITIAL IMPRESSION / ASSESSMENT AND PLAN / ED COURSE  Pertinent labs & imaging results that were available during my care of the patient were reviewed by me and considered in my medical decision making (see chart for details).   DDX: sdh, sah, iph, concussion, fracture, dislocation  [place] R Mcnab is a 84 y.o. who presents to the ED with fall as described above.  Patient is AFVSS in ED. Exam as above. Given current presentation have considered the above differential.   Clinical Course as of Mar 16 1232  Fri Mar 16, 2018  1053 Discussed the results of imaging testing with patient and family.  I discussed radiographs and consultation with Dr. Lesleigh Noe orthopedics.  Will place sugar tong.   [PR]    Clinical Course User Index [PR] Willy Eddy, MD   Patient with rising troponin.  Given her frailty factors murmur without clear evidence of mechanical versus syncopal episode do believe she benefit from observation in the hospital for further medical management.   As part of my medical decision making, I reviewed the following data within the electronic MEDICAL RECORD NUMBER Nursing notes reviewed and incorporated, Labs reviewed, notes from prior ED visits.  ____________________________________________   FINAL CLINICAL IMPRESSION(S) / ED DIAGNOSES  Final diagnoses:  Syncope and collapse  Other closed intra-articular fracture of distal  end of left radius, initial encounter      NEW MEDICATIONS STARTED DURING THIS VISIT:  New Prescriptions   No medications on file      Note:  This document was prepared using Dragon voice recognition software and may include unintentional dictation errors.    Willy Eddy, MD 03/16/18 1233

## 2018-03-16 NOTE — ED Notes (Signed)
Date and time results received: 03/16/18 0934   Test: Troponin Critical Value: 0.03 ng/mL  Name of Provider Notified: Dr. Roxan Hockeyobinson

## 2018-03-16 NOTE — Consult Note (Signed)
Spoke with ER attending.  Asked to look at xray.  Left distal radius fracture with dorsal comminution and mild dorsal angulation.  Fracture extends into the radiocarpal joint without significant step off.   I do not recommend closed reduction on this 84 year old female.   I recommended patient be placed into sugar tong splint and can follow up in our office next week.  Patient is to be instructed to elevate her left forearm over the weekend to avoid swelling.

## 2018-03-16 NOTE — H&P (Signed)
[employer] [facility] - [place] [facility]   PATIENT NAME: Erika Weiss    MR#:  [id number]  DATE OF BIRTH:  [birth year]  DATE OF ADMISSION:  03/16/2018  PRIMARY CARE PHYSICIAN: Eustaquio Boyden, MD   REQUESTING/REFERRING PHYSICIAN: Roxan Hockey  CHIEF COMPLAINT:   Fall HISTORY OF PRESENT ILLNESS:  Erika Weiss  is a 84 y.o. female with a known history of dementia who presents to the ER due to above complaint.  Apparently patient's neighbor found her at her house laying in the yard.  Patient has severe dementia and no knows exactly why she was outside in what happened.  She has bruising around her left eye and swelling on the left side of the forehead.  She also has suffered a wrist fracture.  In the emergency room she is noted to have a troponin of 0.05.  Family is at bedside and cannot provide HPI as well.  PAST MEDICAL HISTORY:   Past Medical History:  Diagnosis Date  . Finger wound, simple, open, subsequent encounter 01/24/2017  . Heart murmur new 2017?  Marland Kitchen History of colon cancer   . HLD (hyperlipidemia)   . Hyperglycemia   . Hypertension   . Osteoarthritis    knees  . Varicose veins of lower extremities with other complications     PAST SURGICAL HISTORY:   Past Surgical History:  Procedure Laterality Date  . ABDOMINAL HYSTERECTOMY  remote   fibroids  . COLON SURGERY  1999   colon cancer  . COLONOSCOPY  12/2008   TAx1 Evette Cristal)  . EYE SURGERY  2010  . INGUINAL HERNIA REPAIR  2001  . VEIN SURGERY Left 2008    SOCIAL HISTORY:   Social History   Tobacco Use  . Smoking status: Former Smoker    Last attempt to quit: 04/25/1978    Years since quitting: 39.9  . Smokeless tobacco: Never Used  Substance Use Topics  . Alcohol use: No    Alcohol/week: 0.0 standard drinks    FAMILY HISTORY:   Family History  Problem Relation Age of Onset  . Cancer Father        colon  . Cancer Sister        breast  . Cancer Brother        pancreas  . Cancer  Brother        colon  . Diabetes Sister   . Diabetes Other        nephews  . Heart disease Sister   . Stroke Mother   . Hypertension Mother   . Hypertension Other        multiple  . CAD Neg Hx     DRUG ALLERGIES:  No Known Allergies  REVIEW OF SYSTEMS:   Review of Systems  Unable to perform ROS: Dementia    MEDICATIONS AT HOME:   Prior to Admission medications   Medication Sig Start Date End Date Taking? Authorizing Provider  amLODipine (NORVASC) 10 MG tablet TAKE 1 TABLET DAILY 01/22/18   Eustaquio Boyden, MD  cyanocobalamin 500 MCG tablet Take 500 mcg by mouth daily.    [provider]  donepezil (ARICEPT) 10 MG tablet TAKE 1 TABLET AT BEDTIME 02/05/18   Eustaquio Boyden, MD  LORazepam (ATIVAN) 0.5 MG tablet TAKE 1/2 TO 1 TABLET BY MOUTH TWICE DAILY AS NEEDED FOR ANXIETY 02/08/18   Eustaquio Boyden, MD  magnesium hydroxide (MILK OF MAGNESIA) 400 MG/5ML suspension Take by mouth as needed for mild constipation. 07/08/16   Eustaquio Boyden, MD  memantine (NAMENDA) 10 MG tablet TAKE 1 TABLET DAILY 08/22/17   Eustaquio Boyden, MD  metoprolol succinate (TOPROL-XL) 25 MG 24 hr tablet TAKE 2 TABLETS BY MOUTH DAILY, TAKE WITH OR IMMEDIATELY FOLLOWING A MEAL (NOTE NEW DIRECTIONS) 02/28/18   Eustaquio Boyden, MD  naproxen sodium (ANAPROX) 220 MG tablet Take 220 mg by mouth as needed.    [provider]  sertraline (ZOLOFT) 100 MG tablet Take 1 tablet (100 mg total) by mouth daily. 02/09/18   Eustaquio Boyden, MD      VITAL SIGNS:  Blood pressure (!) 160/98, pulse 67, temperature 97.8 F (36.6 C), temperature source Oral, resp. rate (!) 25, height 5\' 5"  (1.651 m), weight 52.6 kg, SpO2 99 %.  PHYSICAL EXAMINATION:   Physical Exam  Constitutional: She is oriented to person, place, and time. No distress.  HENT:  Head: Normocephalic.  Eyes: No scleral icterus.  Neck: Normal range of motion. Neck supple. No JVD present. No tracheal deviation present.   Cardiovascular: Normal rate and regular rhythm. Exam reveals no gallop and no friction rub.  Murmur heard. Pulmonary/Chest: Effort normal and breath sounds normal. No respiratory distress. She has no wheezes. She has no rales. She exhibits no tenderness.  Abdominal: Soft. Bowel sounds are normal. She exhibits no distension and no mass. There is no tenderness. There is no rebound and no guarding.  Musculoskeletal: Normal range of motion. She exhibits tenderness (Left wrist). She exhibits no edema.  Swelling left wrist  Neurological: She is alert and oriented to person, place, and time.  Skin: Skin is warm. No rash noted. No erythema.  Bruising around left eye  Psychiatric:  Pleasantly demented      LABORATORY PANEL:   CBC Recent Labs  Lab 03/16/18 0852  WBC 14.6*  HGB 13.1  HCT 40.1  PLT 238   ------------------------------------------------------------------------------------------------------------------  Chemistries  Recent Labs  Lab 03/16/18 0852  NA 144  K 4.3  CL 107  CO2 24  GLUCOSE 135*  BUN 41*  CREATININE 1.43*  CALCIUM 9.8  AST 35  ALT 18  ALKPHOS 72  BILITOT 0.9   ------------------------------------------------------------------------------------------------------------------  Cardiac Enzymes Recent Labs  Lab 03/16/18 1123  TROPONINI 0.05*   ------------------------------------------------------------------------------------------------------------------  RADIOLOGY:  Dg Wrist Complete Left  Result Date: 03/16/2018 CLINICAL DATA:  Fall, bilateral wrist injury. EXAM: LEFT WRIST - COMPLETE 3+ VIEW COMPARISON:  None. FINDINGS: Mildly comminuted and posteriorly displaced fracture through the distal left radius. No visible acute ulnar abnormality. Advanced arthritic changes in the 1st carpometacarpal joint and radiocarpal joint. No subluxation or dislocation. IMPRESSION: Comminuted, displaced intra-articular distal left radial fracture.  Electronically Signed   By: Charlett Nose M.D.   On: 03/16/2018 09:50   Dg Wrist Complete Right  Result Date: 03/16/2018 CLINICAL DATA:  Fall, bilateral wrist injury. EXAM: RIGHT WRIST - COMPLETE 3+ VIEW COMPARISON:  None. FINDINGS: Advanced degenerative changes at the 1st carpometacarpal joint. No acute bony abnormality. Specifically, no fracture, subluxation, or dislocation. IMPRESSION: No acute bony abnormality. Electronically Signed   By: Charlett Nose M.D.   On: 03/16/2018 09:51   Ct Head Wo Contrast  Result Date: 03/16/2018 CLINICAL DATA:  84 year old female with head and neck injury following fall. Initial encounter. EXAM: CT HEAD WITHOUT CONTRAST CT CERVICAL SPINE WITHOUT CONTRAST TECHNIQUE: Multidetector CT imaging of the head and cervical spine was performed following the standard protocol without intravenous contrast. Multiplanar CT image reconstructions of the cervical spine were also generated. COMPARISON:  08/25/2004 cervical spine MR FINDINGS: CT HEAD  FINDINGS Brain: No evidence of acute infarction, hemorrhage, hydrocephalus, extra-axial collection or mass lesion/mass effect. Atrophy, moderate chronic small-vessel white matter ischemic changes and a small remote appearing RIGHT occipital infarct identified. Vascular: Carotid atherosclerotic vascular calcifications noted. Skull: Normal. Negative for fracture or focal lesion. Sinuses/Orbits: No acute finding. Other: A moderate anterior LEFT scalp/forehead hematoma noted. CT CERVICAL SPINE FINDINGS Alignment: 3 mm anterolisthesis of C7 on T1 is unchanged. No acute subluxation identified. Skull base and vertebrae: No acute fracture. No primary bone lesion or focal pathologic process. Soft tissues and spinal canal: No prevertebral fluid or swelling. No visible canal hematoma. Disc levels: Very mild multilevel degenerative disc disease and mild multilevel facet arthropathy noted. Upper chest: No acute abnormality. Other: None IMPRESSION: 1. No  evidence of acute intracranial abnormality. Atrophy, chronic small-vessel white matter ischemic changes and remote RIGHT occipital infarct. 2. Moderate anterior LEFT scalp/forehead hematoma without fracture. 3. No static evidence of acute injury to the cervical spine. Electronically Signed   By: Harmon PierJeffrey  Hu M.D.   On: 03/16/2018 09:32   Ct Cervical Spine Wo Contrast  Result Date: 03/16/2018 CLINICAL DATA:  84 year old female with head and neck injury following fall. Initial encounter. EXAM: CT HEAD WITHOUT CONTRAST CT CERVICAL SPINE WITHOUT CONTRAST TECHNIQUE: Multidetector CT imaging of the head and cervical spine was performed following the standard protocol without intravenous contrast. Multiplanar CT image reconstructions of the cervical spine were also generated. COMPARISON:  08/25/2004 cervical spine MR FINDINGS: CT HEAD FINDINGS Brain: No evidence of acute infarction, hemorrhage, hydrocephalus, extra-axial collection or mass lesion/mass effect. Atrophy, moderate chronic small-vessel white matter ischemic changes and a small remote appearing RIGHT occipital infarct identified. Vascular: Carotid atherosclerotic vascular calcifications noted. Skull: Normal. Negative for fracture or focal lesion. Sinuses/Orbits: No acute finding. Other: A moderate anterior LEFT scalp/forehead hematoma noted. CT CERVICAL SPINE FINDINGS Alignment: 3 mm anterolisthesis of C7 on T1 is unchanged. No acute subluxation identified. Skull base and vertebrae: No acute fracture. No primary bone lesion or focal pathologic process. Soft tissues and spinal canal: No prevertebral fluid or swelling. No visible canal hematoma. Disc levels: Very mild multilevel degenerative disc disease and mild multilevel facet arthropathy noted. Upper chest: No acute abnormality. Other: None IMPRESSION: 1. No evidence of acute intracranial abnormality. Atrophy, chronic small-vessel white matter ischemic changes and remote RIGHT occipital infarct. 2.  Moderate anterior LEFT scalp/forehead hematoma without fracture. 3. No static evidence of acute injury to the cervical spine. Electronically Signed   By: Harmon PierJeffrey  Hu M.D.   On: 03/16/2018 09:32   Dg Chest Portable 1 View  Result Date: 03/16/2018 CLINICAL DATA:  Pain following fall EXAM: PORTABLE CHEST 1 VIEW COMPARISON:  July 08, 2016 FINDINGS: There is no edema or consolidation. Heart size and pulmonary vascularity are normal. No adenopathy. There old healed rib fractures on the left. No pneumothorax. There is aortic atherosclerosis. There is calcification in each carotid artery. IMPRESSION: No edema or consolidation. Old healed rib fractures on the left. No pneumothorax. There is aortic atherosclerosis as well as foci of carotid artery calcification bilaterally. Aortic Atherosclerosis (ICD10-I70.0). Electronically Signed   By: Bretta BangWilliam  Woodruff III M.D.   On: 03/16/2018 09:53   Dg Knee Complete 4 Views Left  Result Date: 03/16/2018 CLINICAL DATA:  Fall.  Contusions and swelling over knees. EXAM: LEFT KNEE - COMPLETE 4+ VIEW COMPARISON:  08/23/2016 FINDINGS: Advanced tricompartment osteoarthritic changes with severe joint space loss and spurring. No joint effusion. No acute bony abnormality. Specifically, no fracture, subluxation, or dislocation.  IMPRESSION: Advanced tricompartment degenerative changes. No acute bony abnormality. Electronically Signed   By: Charlett Nose M.D.   On: 03/16/2018 09:52   Dg Knee Complete 4 Views Right  Result Date: 03/16/2018 CLINICAL DATA:  Fall, bilateral knee pain and swelling. EXAM: RIGHT KNEE - COMPLETE 4+ VIEW COMPARISON:  None. FINDINGS: Moderate tricompartment degenerative changes, most pronounced in the medial compartment with joint space loss and osteophyte formation. No acute bony abnormality. Specifically, no fracture, subluxation, or dislocation. No joint effusion IMPRESSION: Moderate tricompartment degenerative changes. No acute bony abnormality.  Electronically Signed   By: Charlett Nose M.D.   On: 03/16/2018 09:52    EKG:  Normal sinus rhythm no ST elevation or depression heart rate 60  IMPRESSION AND PLAN:   84 year old female who lives with her husband of 60+ years with dementia who was found by her neighbor in her yard.  1.  Elevated troponin: I suspect this is demand ischemia Follow troponins and telemetry  2.  Fall of unclear etiology: Physical therapy consultation  3.  Left wrist fracture: I have contacted Dr. Martha Clan via epic who will see patient later today  He has recommended wrist splint  4.  Dementia: Continue outpatient medications  Case management consult and physical therapy consultation for discharge planning  All the records are reviewed and case discussed with ED provider. Management plans discussed with the patient's family and they are in agreement  CODE STATUS: DNR  TOTAL TIME TAKING CARE OF THIS PATIENT: 45 minutes.    Aseel Uhde M.D on 03/16/2018 at 12:57 PM  Between 7am to 6pm - Pager - [phone]  After 6pm go to [email or web address] - Social research officer, government  [facility]  Office  [phone]  CC: Primary care physician; Eustaquio Boyden, MD

## 2018-03-16 NOTE — Progress Notes (Signed)
Pt arrived to room , alert and oriented x 4. Left wrist in soft splint. Pain o/10, VSS. Bed in lowest position, call bell in reach. Bed alarm active. Family at bedside.

## 2018-03-16 NOTE — Plan of Care (Signed)

## 2018-03-16 NOTE — Progress Notes (Signed)
Patient troponin 0.05 to 0.06. MD notified and Acknowledged. No orders received

## 2018-03-16 NOTE — Progress Notes (Signed)
Chaplain responded to OR for AD. Pt was able to communicate and able to answer basic question. Family said they have a HCPOA and there is no need for AD or update. Chaplain informed staff that there was not any needs for AD due to demented state but no need for one.    03/16/18 1300  Clinical Encounter Type  Visited With Patient and family together  Visit Type Initial  Referral From Physician  Spiritual Encounters  Spiritual Needs Prayer

## 2018-03-17 LAB — CBC
HCT: 38.9 % (ref 36.0–46.0)
Hemoglobin: 12.8 g/dL (ref 12.0–15.0)
MCH: 29.7 pg (ref 26.0–34.0)
MCHC: 32.9 g/dL (ref 30.0–36.0)
MCV: 90.3 fL (ref 80.0–100.0)
PLATELETS: 212 10*3/uL (ref 150–400)
RBC: 4.31 MIL/uL (ref 3.87–5.11)
RDW: 14.5 % (ref 11.5–15.5)
WBC: 13.2 10*3/uL — ABNORMAL HIGH (ref 4.0–10.5)
nRBC: 0 % (ref 0.0–0.2)

## 2018-03-17 LAB — BASIC METABOLIC PANEL
ANION GAP: 8 (ref 5–15)
BUN: 32 mg/dL — AB (ref 8–23)
CALCIUM: 9 mg/dL (ref 8.9–10.3)
CO2: 28 mmol/L (ref 22–32)
Chloride: 106 mmol/L (ref 98–111)
Creatinine, Ser: 0.97 mg/dL (ref 0.44–1.00)
GFR calc Af Amer: 57 mL/min — ABNORMAL LOW (ref >60)
GFR, EST NON AFRICAN AMERICAN: 50 mL/min — AB (ref >60)
GLUCOSE: 146 mg/dL — AB (ref 70–99)
Potassium: 3.5 mmol/L (ref 3.5–5.1)
Sodium: 142 mmol/L (ref 135–145)

## 2018-03-17 LAB — TROPONIN I: TROPONIN I: 0.05 ng/mL — AB (ref <0.03)

## 2018-03-17 MED ORDER — CIPROFLOXACIN HCL 250 MG PO TABS: 250.0000 mg | ORAL_TABLET | Freq: Two times a day (BID) | ORAL | 0 refills | Status: AC

## 2018-03-17 MED ORDER — OXYCODONE HCL 5 MG PO TABS: 5.0000 mg | ORAL_TABLET | Freq: Four times a day (QID) | ORAL | 0 refills | Status: DC | PRN

## 2018-03-17 NOTE — Care Management (Signed)
Patient suffers from weakness, falls and gait instability which impairs their ability to perform activizes of daily living like toileting, bathing and dressing in the home. A walker/cane will not resolve issue with performing activities of daily living. A wheelchair will allow patient to safely perform daily activities. Patient can safely propel the wheelchair in the home or has a caregiver who can assist.

## 2018-03-17 NOTE — Progress Notes (Addendum)
Physical Therapy Treatment Patient Details Name: Erika Weiss MRN: [id number] DOB: Aug 24, 1924 Today's Date: 03/17/2018    History of Present Illness Erika Weiss is a [age]yo female who [facility] after found down in yard by [employer]. Pt sustained facial trauma, bruises to knees, and Left wrist fracture. PMH: dementia.     PT Comments    In bed resting, agrees to session.  Bed mobility in and out of bed with increased time and min guard/assist with reminders not to use LUE. Sitting balance with min guard/supervision.  After transfer to recliner, IV nurse in to restart IV.  Pt assisted back to bed then back up after IV insertion.  Overall mobility and transfer did improve with transfer but continues to require min/mod a x 1 for safe transfer.  Remained in recliner after session with family in attendance.  Gait and overall mobility remains limited.  SNF remains appropriate for discharge.  If pt does not get approved for rehab she will need a wheelchair, 3-in-1 commode, HHPT and 24 hour care if discharged home.   Follow Up Recommendations  SNF     Equipment Recommendations  None recommended by PT    Recommendations for Other Services       Precautions / Restrictions Precautions Precautions: Fall Restrictions Weight Bearing Restrictions: Yes LUE Weight Bearing: Non weight bearing    Mobility  Bed Mobility Overal bed mobility: Needs Assistance Bed Mobility: Supine to Sit;Sit to Supine     Supine to sit: Supervision;Min guard Sit to supine: Min assist   General bed mobility comments: additional effort required and tactile cues to avoid RUE use  Transfers Overall transfer level: Needs assistance Equipment used: None;1 person hand held assist Transfers: Sit to/from Stand Sit to Stand: Min assist;Mod assist         General transfer comment: transfered x 3  Ambulation/Gait Ambulation/Gait assistance: Min assist;Mod assist Gait Distance (Feet): 3 Feet Assistive  device: 1 person hand held assist Gait Pattern/deviations: Step-to pattern Gait velocity: decreased   General Gait Details: limited to transfers but did improve with reps.   Stairs             Wheelchair Mobility    Modified Rankin (Stroke Patients Only)       Balance Overall balance assessment: Needs assistance;History of Falls Sitting-balance support: Feet supported;No upper extremity supported Sitting balance-Leahy Scale: Good     Standing balance support: Single extremity supported Standing balance-Leahy Scale: Poor                              Cognition Arousal/Alertness: Awake/alert Behavior During Therapy: WFL for tasks assessed/performed Overall Cognitive Status: Within Functional Limits for tasks assessed                                        Exercises      General Comments        Pertinent Vitals/Pain Pain Assessment: Faces Faces Pain Scale: Hurts little more    Home Living                      Prior Function            PT Goals (current goals can now be found in the care plan section) Progress towards PT goals: Progressing toward goals    Frequency  7X/week      PT Plan Current plan remains appropriate    Co-evaluation              AM-PAC PT "6 Clicks" Mobility   Outcome Measure  Help needed turning from your back to your side while in a flat bed without using bedrails?: A Little Help needed moving from lying on your back to sitting on the side of a flat bed without using bedrails?: A Little Help needed moving to and from a bed to a chair (including a wheelchair)?: A Lot Help needed standing up from a chair using your arms (e.g., wheelchair or bedside chair)?: A Lot Help needed to walk in hospital room?: Total Help needed climbing 3-5 steps with a railing? : Total 6 Click Score: 12    End of Session Equipment Utilized During Treatment: Gait belt Activity Tolerance: Patient  tolerated treatment well Patient left: in chair;with call bell/phone within reach;with chair alarm set         Time: 1191-4782 PT Time Calculation (min) (ACUTE ONLY): 23 min  Charges:  $Therapeutic Activity: 23-37 mins                     Danielle Dess, PTA 03/17/18, 11:26 AM

## 2018-03-17 NOTE — Care Management Note (Signed)
Case Management Note  Patient Details  Name: Erika Weiss MRN: [id number] Date of Birth: [birth year]  Subjective/Objective:  Patient to be discharged per MD order. Orders in place for home health services. Patient requires heavy assistance with activities of daily living. Md placed orders for DME wheelchair and bedside commode, obtained from [facility], plans for delivery. Patient and family agreeable to home health, [facility] [place] [facility].                      Action/Plan:   Expected Discharge Date:  03/17/18               Expected Discharge Plan:  Home w Home Health Services  In-House Referral:     Discharge planning Services  CM Consult  Post Acute Care Choice:  Durable Medical Equipment, Home Health Choice offered to:  Patient, Adult Children, Sibling  DME Arranged:  Bedside commode, Wheelchair manual DME Agency:  [facility]  HH Arranged:  RN, PT, Nurse's Aide HH Agency:  [facility]  Status of Service:  Completed, signed off  If discussed at Long Length of Stay Meetings, dates discussed:    Additional Comments:  Virgel ManifoldJosh A Claudine Stallings, RN 03/17/2018, 3:45 PM

## 2018-03-17 NOTE — Progress Notes (Signed)
Ice place to left upper extremity per orders

## 2018-03-17 NOTE — Consult Note (Signed)
ORTHOPAEDIC CONSULTATION  REQUESTING PHYSICIAN: Ihor Austin, MD  Chief Complaint: Distal radius fracture, left  HPI: Erika Weiss is a 84 y.o. female who complains of pain in the left wrist status post fall at home.  Patient was brought to the [facility] emergency department where x-rays were taken and revealed a closed comminuted left distal radius fracture.  The emergency room physician indicated the skin was intact.  A sugar tong splint was applied.  I am seeing the patient in her hospital room surrounded by family.  Patient denies other areas of pain.  Past Medical History:  Diagnosis Date  . Finger wound, simple, open, subsequent encounter 01/24/2017  . Heart murmur new 2017?  Marland Kitchen History of colon cancer   . HLD (hyperlipidemia)   . Hyperglycemia   . Hypertension   . Osteoarthritis    knees  . Varicose veins of lower extremities with other complications    Past Surgical History:  Procedure Laterality Date  . ABDOMINAL HYSTERECTOMY  remote   fibroids  . COLON SURGERY  1999   colon cancer  . COLONOSCOPY  12/2008   TAx1 Evette Cristal)  . EYE SURGERY  2010  . INGUINAL HERNIA REPAIR  2001  . VEIN SURGERY Left 2008   Social History   Socioeconomic History  . Marital status: Married    Spouse name: Not on file  . Number of children: Not on file  . Years of education: Not on file  . Highest education level: Not on file  Occupational History  . Not on file  Social Needs  . Financial resource strain: Not on file  . Food insecurity:    Worry: Not on file    Inability: Not on file  . Transportation needs:    Medical: Not on file    Non-medical: Not on file  Tobacco Use  . Smoking status: Former Smoker    Last attempt to quit: 04/25/1978    Years since quitting: 39.9  . Smokeless tobacco: Never Used  Substance and Sexual Activity  . Alcohol use: No    Alcohol/week: 0.0 standard drinks  . Drug use: No  . Sexual activity: Never  Lifestyle  . Physical activity:     Days per week: Not on file    Minutes per session: Not on file  . Stress: Not on file  Relationships  . Social connections:    Talks on phone: Not on file    Gets together: Not on file    Attends religious service: Not on file    Active member of club or organization: Not on file    Attends meetings of clubs or organizations: Not on file    Relationship status: Not on file  Other Topics Concern  . Not on file  Social History Narrative   Lives with husband   Occ: retired, worked in Education officer, environmental at [employer]   Activity: enjoys walking    Diet: good water, fruits/vegetables daily    Family History  Problem Relation Age of Onset  . Cancer Father        colon  . Cancer Sister        breast  . Cancer Brother        pancreas  . Cancer Brother        colon  . Diabetes Sister   . Diabetes Other        nephews  . Heart disease Sister   . Stroke Mother   . Hypertension Mother   .  Hypertension Other        multiple  . CAD Neg Hx    No Known Allergies Prior to Admission medications   Medication Sig Start Date End Date Taking? Authorizing Provider  amLODipine (NORVASC) 10 MG tablet TAKE 1 TABLET DAILY Patient taking differently: Take 10 mg by mouth daily.  01/22/18  Yes Eustaquio Boyden, MD  cyanocobalamin 500 MCG tablet Take 500 mcg by mouth daily.   Yes [provider]  donepezil (ARICEPT) 10 MG tablet TAKE 1 TABLET AT BEDTIME Patient taking differently: Take 10 mg by mouth at bedtime.  02/05/18  Yes Eustaquio Boyden, MD  LORazepam (ATIVAN) 0.5 MG tablet TAKE 1/2 TO 1 TABLET BY MOUTH TWICE DAILY AS NEEDED FOR ANXIETY 02/08/18  Yes Eustaquio Boyden, MD  magnesium hydroxide (MILK OF MAGNESIA) 400 MG/5ML suspension Take by mouth as needed for mild constipation. 07/08/16  Yes Eustaquio Boyden, MD  memantine (NAMENDA) 10 MG tablet TAKE 1 TABLET DAILY Patient taking differently: Take 10 mg by mouth daily.  08/22/17  Yes Eustaquio Boyden, MD  metoprolol succinate  (TOPROL-XL) 25 MG 24 hr tablet TAKE 2 TABLETS BY MOUTH DAILY, TAKE WITH OR IMMEDIATELY FOLLOWING A MEAL (NOTE NEW DIRECTIONS) Patient taking differently: Take 50 mg by mouth daily.  02/28/18  Yes Eustaquio Boyden, MD  naproxen sodium (ANAPROX) 220 MG tablet Take 220 mg by mouth as needed (pain).    Yes [provider]  sertraline (ZOLOFT) 100 MG tablet Take 1 tablet (100 mg total) by mouth daily. 02/09/18  Yes Eustaquio Boyden, MD   Dg Wrist Complete Left  Result Date: 03/16/2018 CLINICAL DATA:  Fall, bilateral wrist injury. EXAM: LEFT WRIST - COMPLETE 3+ VIEW COMPARISON:  None. FINDINGS: Mildly comminuted and posteriorly displaced fracture through the distal left radius. No visible acute ulnar abnormality. Advanced arthritic changes in the 1st carpometacarpal joint and radiocarpal joint. No subluxation or dislocation. IMPRESSION: Comminuted, displaced intra-articular distal left radial fracture. Electronically Signed   By: Charlett Nose M.D.   On: 03/16/2018 09:50   Dg Wrist Complete Right  Result Date: 03/16/2018 CLINICAL DATA:  Fall, bilateral wrist injury. EXAM: RIGHT WRIST - COMPLETE 3+ VIEW COMPARISON:  None. FINDINGS: Advanced degenerative changes at the 1st carpometacarpal joint. No acute bony abnormality. Specifically, no fracture, subluxation, or dislocation. IMPRESSION: No acute bony abnormality. Electronically Signed   By: Charlett Nose M.D.   On: 03/16/2018 09:51   Ct Head Wo Contrast  Result Date: 03/16/2018 CLINICAL DATA:  84 year old female with head and neck injury following fall. Initial encounter. EXAM: CT HEAD WITHOUT CONTRAST CT CERVICAL SPINE WITHOUT CONTRAST TECHNIQUE: Multidetector CT imaging of the head and cervical spine was performed following the standard protocol without intravenous contrast. Multiplanar CT image reconstructions of the cervical spine were also generated. COMPARISON:  08/25/2004 cervical spine MR FINDINGS: CT HEAD FINDINGS Brain: No evidence of  acute infarction, hemorrhage, hydrocephalus, extra-axial collection or mass lesion/mass effect. Atrophy, moderate chronic small-vessel white matter ischemic changes and a small remote appearing RIGHT occipital infarct identified. Vascular: Carotid atherosclerotic vascular calcifications noted. Skull: Normal. Negative for fracture or focal lesion. Sinuses/Orbits: No acute finding. Other: A moderate anterior LEFT scalp/forehead hematoma noted. CT CERVICAL SPINE FINDINGS Alignment: 3 mm anterolisthesis of C7 on T1 is unchanged. No acute subluxation identified. Skull base and vertebrae: No acute fracture. No primary bone lesion or focal pathologic process. Soft tissues and spinal canal: No prevertebral fluid or swelling. No visible canal hematoma. Disc levels: Very mild multilevel degenerative disc disease and  mild multilevel facet arthropathy noted. Upper chest: No acute abnormality. Other: None IMPRESSION: 1. No evidence of acute intracranial abnormality. Atrophy, chronic small-vessel white matter ischemic changes and remote RIGHT occipital infarct. 2. Moderate anterior LEFT scalp/forehead hematoma without fracture. 3. No static evidence of acute injury to the cervical spine. Electronically Signed   By: Harmon Pier M.D.   On: 03/16/2018 09:32   Ct Cervical Spine Wo Contrast  Result Date: 03/16/2018 CLINICAL DATA:  84 year old female with head and neck injury following fall. Initial encounter. EXAM: CT HEAD WITHOUT CONTRAST CT CERVICAL SPINE WITHOUT CONTRAST TECHNIQUE: Multidetector CT imaging of the head and cervical spine was performed following the standard protocol without intravenous contrast. Multiplanar CT image reconstructions of the cervical spine were also generated. COMPARISON:  08/25/2004 cervical spine MR FINDINGS: CT HEAD FINDINGS Brain: No evidence of acute infarction, hemorrhage, hydrocephalus, extra-axial collection or mass lesion/mass effect. Atrophy, moderate chronic small-vessel white matter  ischemic changes and a small remote appearing RIGHT occipital infarct identified. Vascular: Carotid atherosclerotic vascular calcifications noted. Skull: Normal. Negative for fracture or focal lesion. Sinuses/Orbits: No acute finding. Other: A moderate anterior LEFT scalp/forehead hematoma noted. CT CERVICAL SPINE FINDINGS Alignment: 3 mm anterolisthesis of C7 on T1 is unchanged. No acute subluxation identified. Skull base and vertebrae: No acute fracture. No primary bone lesion or focal pathologic process. Soft tissues and spinal canal: No prevertebral fluid or swelling. No visible canal hematoma. Disc levels: Very mild multilevel degenerative disc disease and mild multilevel facet arthropathy noted. Upper chest: No acute abnormality. Other: None IMPRESSION: 1. No evidence of acute intracranial abnormality. Atrophy, chronic small-vessel white matter ischemic changes and remote RIGHT occipital infarct. 2. Moderate anterior LEFT scalp/forehead hematoma without fracture. 3. No static evidence of acute injury to the cervical spine. Electronically Signed   By: Harmon Pier M.D.   On: 03/16/2018 09:32   Dg Chest Portable 1 View  Result Date: 03/16/2018 CLINICAL DATA:  Pain following fall EXAM: PORTABLE CHEST 1 VIEW COMPARISON:  July 08, 2016 FINDINGS: There is no edema or consolidation. Heart size and pulmonary vascularity are normal. No adenopathy. There old healed rib fractures on the left. No pneumothorax. There is aortic atherosclerosis. There is calcification in each carotid artery. IMPRESSION: No edema or consolidation. Old healed rib fractures on the left. No pneumothorax. There is aortic atherosclerosis as well as foci of carotid artery calcification bilaterally. Aortic Atherosclerosis (ICD10-I70.0). Electronically Signed   By: Bretta Bang III M.D.   On: 03/16/2018 09:53   Dg Knee Complete 4 Views Left  Result Date: 03/16/2018 CLINICAL DATA:  Fall.  Contusions and swelling over knees. EXAM: LEFT  KNEE - COMPLETE 4+ VIEW COMPARISON:  08/23/2016 FINDINGS: Advanced tricompartment osteoarthritic changes with severe joint space loss and spurring. No joint effusion. No acute bony abnormality. Specifically, no fracture, subluxation, or dislocation. IMPRESSION: Advanced tricompartment degenerative changes. No acute bony abnormality. Electronically Signed   By: Charlett Nose M.D.   On: 03/16/2018 09:52   Dg Knee Complete 4 Views Right  Result Date: 03/16/2018 CLINICAL DATA:  Fall, bilateral knee pain and swelling. EXAM: RIGHT KNEE - COMPLETE 4+ VIEW COMPARISON:  None. FINDINGS: Moderate tricompartment degenerative changes, most pronounced in the medial compartment with joint space loss and osteophyte formation. No acute bony abnormality. Specifically, no fracture, subluxation, or dislocation. No joint effusion IMPRESSION: Moderate tricompartment degenerative changes. No acute bony abnormality. Electronically Signed   By: Charlett Nose M.D.   On: 03/16/2018 09:52    Positive ROS: All  other systems have been reviewed and were otherwise negative with the exception of those mentioned in the HPI and as above.  Physical Exam: General: Alert, no acute distress, patient has ecchymosis and swelling around her eyes/face.  MUSCULOSKELETAL: Left wrist: Patient has a sugar tong splint in place.  She has intact sensation to light touch in all 5 digits of the left hand.  There is no evidence of skin breakdown around the splint.  Since fingers are well-perfused.  She can flex and extend her fingers but the range of motion is limited due to swelling.  Assessment: Left distal radius fracture  Plan: I shared the x-rays with the patient and her family.  I explained that the fracture was in acceptable position.  I explained that if the fracture maintains its current position I am hopeful she can avoid surgery for this fracture.  Patient should continue to elevate the left upper extremity to reduce swelling.  Ice packs  will be ordered to be applied to the left wrist.  Patient should avoid lifting or weightbearing on the left wrist.  Patient will follow-up with me in the office in 1 to 2 weeks.  She should leave the splint on until her follow-up.    Erika Weiss, Erika Kihn, MD    03/17/2018 1:24 PM

## 2018-03-17 NOTE — Care Management Obs Status (Signed)
MEDICARE OBSERVATION STATUS NOTIFICATION   Patient Details  Name: Erika Weiss MRN: [id number] Date of Birth: 05-23-1924   Medicare Observation Status Notification Given:  Yes    Bria Sparr A Ashelyn Mccravy, RN 03/17/2018, 2:12 PM

## 2018-03-17 NOTE — Discharge Summary (Signed)
[facility] - [place] at [facility]   PATIENT NAME: Erika Weiss    MR#:  [id number]  DATE OF BIRTH:  Aug 16, 1924  DATE OF ADMISSION:  03/16/2018 ADMITTING PHYSICIAN: Adrian Saran, MD  DATE OF DISCHARGE: 03/17/2018  PRIMARY CARE PHYSICIAN: Eustaquio Boyden, MD   ADMISSION DIAGNOSIS:  Syncope and collapse [R55] Other closed intra-articular fracture of distal end of left radius, initial encounter [S52.572A] Dementia Acute cystitis DISCHARGE DIAGNOSIS:  Active Problems:   Elevated troponin Accidental fall Left radius fracture Gait instability Ambulatory dysfunction Acute cystitis SECONDARY DIAGNOSIS:   Past Medical History:  Diagnosis Date  . Finger wound, simple, open, subsequent encounter 01/24/2017  . Heart murmur new 2017?  Marland Kitchen History of colon cancer   . HLD (hyperlipidemia)   . Hyperglycemia   . Hypertension   . Osteoarthritis    knees  . Varicose veins of lower extremities with other complications      ADMITTING HISTORY Erika Shanks  is a 84 y.o. female with a known history of dementia who presents to the ER due to above complaint.  Apparently patient's neighbor found her at her house laying in the yard.  Patient has severe dementia and no knows exactly why she was outside in what happened.  She has bruising around her left eye and swelling on the left side of the forehead.  She also has suffered a wrist fracture. In the emergency room she is noted to have a troponin of 0.05. Family is at bedside and cannot provide HPI as well.   HOSPITAL COURSE:  Patient was admitted to medical floor with cardiac monitoring.  Serial troponins were cycled.  Mild bump in troponin secondary to demand ischemia.  Patient was worked up with CT head x-rays of the knee and x-ray of the wrist. patient sustained a left wrist fracture secondary to fall .  She was evaluated by orthopedic service.  She has a splint placed and no surgical intervention was recommended.   Patient received IV Rocephin antibiotic for cystitis and tolerated antibiotic well.  She received physical therapy.  Patient will be discharged home with outpatient orthopedic service follow-up along with home health services.  Patient has a supportive family.  CONSULTS OBTAINED:    DRUG ALLERGIES:  No Known Allergies  DISCHARGE MEDICATIONS:   Allergies as of 03/17/2018   No Known Allergies     Medication List    STOP taking these medications   metoprolol succinate 25 MG 24 hr tablet Commonly known as:  TOPROL-XL     TAKE these medications   amLODipine 10 MG tablet Commonly known as:  NORVASC TAKE 1 TABLET DAILY   ciprofloxacin 250 MG tablet Commonly known as:  CIPRO Take 1 tablet (250 mg total) by mouth 2 (two) times daily for 3 days.   donepezil 10 MG tablet Commonly known as:  ARICEPT TAKE 1 TABLET AT BEDTIME   LORazepam 0.5 MG tablet Commonly known as:  ATIVAN TAKE 1/2 TO 1 TABLET BY MOUTH TWICE DAILY AS NEEDED FOR ANXIETY   memantine 10 MG tablet Commonly known as:  NAMENDA TAKE 1 TABLET DAILY   MILK OF MAGNESIA 400 MG/5ML suspension Generic drug:  magnesium hydroxide Take by mouth as needed for mild constipation.   naproxen sodium 220 MG tablet Commonly known as:  ALEVE Take 220 mg by mouth as needed (pain).   oxyCODONE 5 MG immediate release tablet Commonly known as:  Oxy IR/ROXICODONE Take 1-2 tablets (5-10 mg total) by mouth every 6 (six) hours  as needed for moderate pain or severe pain.   sertraline 100 MG tablet Commonly known as:  ZOLOFT Take 1 tablet (100 mg total) by mouth daily.   vitamin B-12 500 MCG tablet Commonly known as:  CYANOCOBALAMIN Take 500 mcg by mouth daily.       Today  Patient seen and evaluated today Tolerating diet well Has splint to the left upper extremity  VITAL SIGNS:  Blood pressure (!) 161/63, pulse (!) 47, temperature (!) 97.4 F (36.3 C), temperature source Oral, resp. rate 18, height 5\' 5"  (1.651 m),  weight 52.6 kg, SpO2 97 %.  I/O:    Intake/Output Summary (Last 24 hours) at 03/17/2018 1523 Last data filed at 03/17/2018 1454 Gross per 24 hour  Intake 778.58 ml  Output 1025 ml  Net -246.42 ml    PHYSICAL EXAMINATION:  Physical Exam  GENERAL:  84 y.o.-year-old patient lying in the bed with no acute distress.  LUNGS: Normal breath sounds bilaterally, no wheezing, rales,rhonchi or crepitation. No use of accessory muscles of respiration.  CARDIOVASCULAR: S1, S2 normal. No murmurs, rubs, or gallops.  ABDOMEN: Soft, non-tender, non-distended. Bowel sounds present. No organomegaly or mass.  NEUROLOGIC: Moves all 4 extremities. PSYCHIATRIC: The patient is alert and oriented x 3.  SKIN: No obvious rash, lesion, or ulcer.   DATA REVIEW:   CBC Recent Labs  Lab 03/17/18 0207  WBC 13.2*  HGB 12.8  HCT 38.9  PLT 212    Chemistries  Recent Labs  Lab 03/16/18 0852 03/17/18 0207  NA 144 142  K 4.3 3.5  CL 107 106  CO2 24 28  GLUCOSE 135* 146*  BUN 41* 32*  CREATININE 1.43* 0.97  CALCIUM 9.8 9.0  AST 35  --   ALT 18  --   ALKPHOS 72  --   BILITOT 0.9  --     Cardiac Enzymes Recent Labs  Lab 03/17/18 0207  TROPONINI 0.05*    Microbiology Results  No results found for this or any previous visit.  RADIOLOGY:  Dg Wrist Complete Left  Result Date: 03/16/2018 CLINICAL DATA:  Fall, bilateral wrist injury. EXAM: LEFT WRIST - COMPLETE 3+ VIEW COMPARISON:  None. FINDINGS: Mildly comminuted and posteriorly displaced fracture through the distal left radius. No visible acute ulnar abnormality. Advanced arthritic changes in the 1st carpometacarpal joint and radiocarpal joint. No subluxation or dislocation. IMPRESSION: Comminuted, displaced intra-articular distal left radial fracture. Electronically Signed   By: Charlett Nose M.D.   On: 03/16/2018 09:50   Dg Wrist Complete Right  Result Date: 03/16/2018 CLINICAL DATA:  Fall, bilateral wrist injury. EXAM: RIGHT WRIST -  COMPLETE 3+ VIEW COMPARISON:  None. FINDINGS: Advanced degenerative changes at the 1st carpometacarpal joint. No acute bony abnormality. Specifically, no fracture, subluxation, or dislocation. IMPRESSION: No acute bony abnormality. Electronically Signed   By: Charlett Nose M.D.   On: 03/16/2018 09:51   Ct Head Wo Contrast  Result Date: 03/16/2018 CLINICAL DATA:  84 year old female with head and neck injury following fall. Initial encounter. EXAM: CT HEAD WITHOUT CONTRAST CT CERVICAL SPINE WITHOUT CONTRAST TECHNIQUE: Multidetector CT imaging of the head and cervical spine was performed following the standard protocol without intravenous contrast. Multiplanar CT image reconstructions of the cervical spine were also generated. COMPARISON:  08/25/2004 cervical spine MR FINDINGS: CT HEAD FINDINGS Brain: No evidence of acute infarction, hemorrhage, hydrocephalus, extra-axial collection or mass lesion/mass effect. Atrophy, moderate chronic small-vessel white matter ischemic changes and a small remote appearing RIGHT occipital infarct identified.  Vascular: Carotid atherosclerotic vascular calcifications noted. Skull: Normal. Negative for fracture or focal lesion. Sinuses/Orbits: No acute finding. Other: A moderate anterior LEFT scalp/forehead hematoma noted. CT CERVICAL SPINE FINDINGS Alignment: 3 mm anterolisthesis of C7 on T1 is unchanged. No acute subluxation identified. Skull base and vertebrae: No acute fracture. No primary bone lesion or focal pathologic process. Soft tissues and spinal canal: No prevertebral fluid or swelling. No visible canal hematoma. Disc levels: Very mild multilevel degenerative disc disease and mild multilevel facet arthropathy noted. Upper chest: No acute abnormality. Other: None IMPRESSION: 1. No evidence of acute intracranial abnormality. Atrophy, chronic small-vessel white matter ischemic changes and remote RIGHT occipital infarct. 2. Moderate anterior LEFT scalp/forehead hematoma  without fracture. 3. No static evidence of acute injury to the cervical spine. Electronically Signed   By: Harmon PierJeffrey  Hu M.D.   On: 03/16/2018 09:32   Ct Cervical Spine Wo Contrast  Result Date: 03/16/2018 CLINICAL DATA:  84 year old female with head and neck injury following fall. Initial encounter. EXAM: CT HEAD WITHOUT CONTRAST CT CERVICAL SPINE WITHOUT CONTRAST TECHNIQUE: Multidetector CT imaging of the head and cervical spine was performed following the standard protocol without intravenous contrast. Multiplanar CT image reconstructions of the cervical spine were also generated. COMPARISON:  08/25/2004 cervical spine MR FINDINGS: CT HEAD FINDINGS Brain: No evidence of acute infarction, hemorrhage, hydrocephalus, extra-axial collection or mass lesion/mass effect. Atrophy, moderate chronic small-vessel white matter ischemic changes and a small remote appearing RIGHT occipital infarct identified. Vascular: Carotid atherosclerotic vascular calcifications noted. Skull: Normal. Negative for fracture or focal lesion. Sinuses/Orbits: No acute finding. Other: A moderate anterior LEFT scalp/forehead hematoma noted. CT CERVICAL SPINE FINDINGS Alignment: 3 mm anterolisthesis of C7 on T1 is unchanged. No acute subluxation identified. Skull base and vertebrae: No acute fracture. No primary bone lesion or focal pathologic process. Soft tissues and spinal canal: No prevertebral fluid or swelling. No visible canal hematoma. Disc levels: Very mild multilevel degenerative disc disease and mild multilevel facet arthropathy noted. Upper chest: No acute abnormality. Other: None IMPRESSION: 1. No evidence of acute intracranial abnormality. Atrophy, chronic small-vessel white matter ischemic changes and remote RIGHT occipital infarct. 2. Moderate anterior LEFT scalp/forehead hematoma without fracture. 3. No static evidence of acute injury to the cervical spine. Electronically Signed   By: Harmon PierJeffrey  Hu M.D.   On: 03/16/2018 09:32    Dg Chest Portable 1 View  Result Date: 03/16/2018 CLINICAL DATA:  Pain following fall EXAM: PORTABLE CHEST 1 VIEW COMPARISON:  July 08, 2016 FINDINGS: There is no edema or consolidation. Heart size and pulmonary vascularity are normal. No adenopathy. There old healed rib fractures on the left. No pneumothorax. There is aortic atherosclerosis. There is calcification in each carotid artery. IMPRESSION: No edema or consolidation. Old healed rib fractures on the left. No pneumothorax. There is aortic atherosclerosis as well as foci of carotid artery calcification bilaterally. Aortic Atherosclerosis (ICD10-I70.0). Electronically Signed   By: Bretta BangWilliam  Woodruff III M.D.   On: 03/16/2018 09:53   Dg Knee Complete 4 Views Left  Result Date: 03/16/2018 CLINICAL DATA:  Fall.  Contusions and swelling over knees. EXAM: LEFT KNEE - COMPLETE 4+ VIEW COMPARISON:  08/23/2016 FINDINGS: Advanced tricompartment osteoarthritic changes with severe joint space loss and spurring. No joint effusion. No acute bony abnormality. Specifically, no fracture, subluxation, or dislocation. IMPRESSION: Advanced tricompartment degenerative changes. No acute bony abnormality. Electronically Signed   By: Charlett NoseKevin  Dover M.D.   On: 03/16/2018 09:52   Dg Knee Complete 4 Views Right  Result Date: 03/16/2018 CLINICAL DATA:  Fall, bilateral knee pain and swelling. EXAM: RIGHT KNEE - COMPLETE 4+ VIEW COMPARISON:  None. FINDINGS: Moderate tricompartment degenerative changes, most pronounced in the medial compartment with joint space loss and osteophyte formation. No acute bony abnormality. Specifically, no fracture, subluxation, or dislocation. No joint effusion IMPRESSION: Moderate tricompartment degenerative changes. No acute bony abnormality. Electronically Signed   By: Charlett Nose M.D.   On: 03/16/2018 09:52    Follow up with PCP in 1 week.  Management plans discussed with the patient, family and they are in agreement.  CODE STATUS:  DNR    Code Status Orders  (From admission, onward)         Start     Ordered   03/16/18 1354  Do not attempt resuscitation (DNR)  Continuous    Question Answer Comment  In the event of cardiac or respiratory ARREST Do not call a "code blue"   In the event of cardiac or respiratory ARREST Do not perform Intubation, CPR, defibrillation or ACLS   In the event of cardiac or respiratory ARREST Use medication by any route, position, wound care, and other measures to relive pain and suffering. May use oxygen, suction and manual treatment of airway obstruction as needed for comfort.      03/16/18 1353        Code Status History    This patient has a current code status but no historical code status.      TOTAL TIME TAKING CARE OF THIS PATIENT ON DAY OF DISCHARGE: more than 30 minutes.   Ihor Austin M.D on 03/17/2018 at 3:23 PM  Between 7am to 6pm - Pager - [phone]  After 6pm go to [email or web address] - password [email or web address]  [facility] [facility]  Office  [phone]  CC: Primary care physician; Eustaquio Boyden, MD  Note: This dictation was prepared with Dragon dictation along with smaller phrase technology. Any transcriptional errors that result from this process are unintentional.

## 2018-03-19 ENCOUNTER — Telehealth: Payer: Self-pay

## 2018-03-19 ENCOUNTER — Telehealth: Payer: Self-pay | Admitting: Family Medicine

## 2018-03-19 NOTE — Telephone Encounter (Signed)
Pt's daughter Deeann DowseGayle"Marion" is calling and stated pt fell over the weekend and was taken to the hospital and was put on some medication and was told to stop taking her metoprolol. Pt's daughter have questions concerning this change and wish to speak to nurse to get an explanation.

## 2018-03-19 NOTE — Telephone Encounter (Signed)
Noted  

## 2018-03-19 NOTE — Telephone Encounter (Signed)
Agree with this. Thanks.  

## 2018-03-19 NOTE — Telephone Encounter (Signed)
Eprescribed.

## 2018-03-19 NOTE — Telephone Encounter (Signed)
Disregard message below. Appointment made for 03/27/18 not the 10th., thank you

## 2018-03-19 NOTE — Telephone Encounter (Signed)
[facility] faxed note 03/17/18 at 9:05 PM. Pt already has HFU appt with Dr Reece AgarG on 03/27/18 at 11:30.

## 2018-03-19 NOTE — Telephone Encounter (Signed)
Need verbal orders for PT   2x's a week for 4 weeks effective today 11.25.19     Need OT eval for OT and home health aide for 2x's a week for 4 wks

## 2018-03-19 NOTE — Telephone Encounter (Signed)
Left message for patient to call back to do TCM call and see if we can move up hospital f/u appointment from 04/03/18 to sooner date

## 2018-03-19 NOTE — Telephone Encounter (Signed)
Call daughter on her cell

## 2018-03-20 ENCOUNTER — Telehealth: Payer: Self-pay

## 2018-03-20 NOTE — Telephone Encounter (Signed)
Pt's daughter is waiting on call back from nurse concerning previous  Message.

## 2018-03-20 NOTE — Telephone Encounter (Signed)
Transitional Care Management Follow-up Telephone Call    Date discharged? 03/17/18  How have you been since you were released from the hospital? Recovering.    Any patient concerns? Unable to place cast on hand and wrist due to swelling.     Items Reviewed:  Medications reviewed: Yes  Allergies reviewed: Yes  Dietary changes reviewed: Yes  Referrals reviewed: Yes   Functional Questionnaire:  Independent - I Dependent - D    Activities of Daily Living (ADLs):    Personal hygiene - I Dressing - I Eating - I Maintaining continence - I Transferring - I   Independent Activities of Daily Living (iADLs): Basic communication skills - I Transportation - D Meal preparation  - D Shopping - D Housework - D Managing medications - D  Managing personal finances - D   Confirmed importance and date/time of follow-up visits scheduled YES  Provider Appointment booked with PCP 03/27/18 @ 1500  Confirmed with patient if condition begins to worsen call PCP or go to the ER.  Patient was given the office number and encouraged to call back with question or concerns: YES

## 2018-03-20 NOTE — Telephone Encounter (Addendum)
Transition Care Management Follow-up Telephone Call Spoke with Erika ReichertMarian, patient's daughter, DPR on file to speak with her. Patient is not able to answer the questions.  Date discharged? 03/17/18   How have you been since you were released from the hospital? Per daughter patient is doing better except the swelling of her hand.   Do you understand why you were in the hospital? She may not remember this per daughter.   Do you understand the discharge instructions? Daughters are helping patient take of things.   Where were you discharged to? Home.   Items Reviewed:  Medications reviewed: Collier BullockYes-has a question about the medication in the other message in the chart  Allergies reviewed: Yes  Dietary changes reviewed: Yes-no changes were made  Referrals reviewed: to see orthopedic-patient has followed up with them, also has physical therapy set up for the patient.   Functional Questionnaire:   Activities of Daily Living (ADLs):   She states they are independent in the following: feeding States they require assistance with the following: ambulation, bathing, hygiene, dressing, grooming, toileting.   Any transportation issues/concerns?: No   Any patient concerns? Not at this time just the swelling in her hand preventing her from getting a cast. Patient is scheduled to follow up with orthopedic on this.   Confirmed importance and date/time of follow-up visits scheduled yes  Provider Appointment booked with Dr Sharen HonesGutierrez on 03/27/18.  Confirmed with patient if condition begins to worsen call PCP or go to the ER.  Patient was given the office number and encouraged to call back with question or concerns.  : yes

## 2018-03-20 NOTE — Telephone Encounter (Signed)
TH sent note that Erika LundborgMarion Weiss (DPR signed) requesting cb about why metoprolol was dc. BP on 03/19/18 was 140/70.

## 2018-03-20 NOTE — Telephone Encounter (Signed)
Noted duplicate TCM phone calls.

## 2018-03-20 NOTE — Telephone Encounter (Signed)
Spoke with pt's daughter, Erika ReichertMarian (on dpr), about pt being off metoprolol.  States the metoprolol was stopped when pt was started on abx.  But tomorrow is last dose for abx.  She is asking if pt should resume the metoprolol.  Please advise.

## 2018-03-20 NOTE — Telephone Encounter (Addendum)
Metoprolol was also held because her heart rate was very low. Likely will want to stay off metoprolol until she comes in to see me unless blood pressures much higher at home, then may start different BP med in addition to amlodipine. Let me know how HR and BP running at home.

## 2018-03-20 NOTE — Telephone Encounter (Signed)
Left message on vm [facility] informing her Dr. Reece AgarG is giving verbal orders for [facility], [facility].

## 2018-03-21 ENCOUNTER — Telehealth: Payer: Self-pay | Admitting: Family Medicine

## 2018-03-21 NOTE — Telephone Encounter (Signed)
Pt's daughter Shirlee LimerickMarion called office in regards to being discharged from the hospital. They prescribed the pt Ciprofloxacin 250mg  antibiotic and the oxycodone for pain. They want to take her off the metoprolol. The daughter wants to know why they took her off of that and if she really needs to stop taking it. Please advise. Best cb is [phone] her cell and her mom's home [phone].

## 2018-03-21 NOTE — Telephone Encounter (Signed)
Spoke with pt's daughter, Armando ReichertMarian, relaying Dr. Timoteo ExposeG's message.  Verbalizes understanding and expresses her thanks for the call back.

## 2018-03-21 NOTE — Telephone Encounter (Signed)
Left message for pt to call back.  Need to relay Dr. G's message.  

## 2018-03-21 NOTE — Telephone Encounter (Signed)
Please advise 

## 2018-03-21 NOTE — Telephone Encounter (Signed)
This was already addressed. See 4 phone notes ago.

## 2018-03-26 ENCOUNTER — Telehealth: Payer: Self-pay

## 2018-03-26 NOTE — Telephone Encounter (Signed)
[facility]. [name] called on-call to state patient and husband need more care. Left message, requested return phone call.

## 2018-03-27 ENCOUNTER — Encounter: Payer: Self-pay | Admitting: Family Medicine

## 2018-03-27 ENCOUNTER — Ambulatory Visit (INDEPENDENT_AMBULATORY_CARE_PROVIDER_SITE_OTHER): Payer: Medicare Other | Admitting: Family Medicine

## 2018-03-27 VITALS — BP 140/82 | HR 69 | Temp 98.6°F | Ht 62.75 in | Wt 119.5 lb

## 2018-03-27 DIAGNOSIS — I10 Essential (primary) hypertension: Secondary | ICD-10-CM | POA: Diagnosis not present

## 2018-03-27 DIAGNOSIS — W19XXXA Unspecified fall, initial encounter: Secondary | ICD-10-CM

## 2018-03-27 DIAGNOSIS — F0391 Unspecified dementia with behavioral disturbance: Secondary | ICD-10-CM | POA: Diagnosis not present

## 2018-03-27 DIAGNOSIS — R778 Other specified abnormalities of plasma proteins: Secondary | ICD-10-CM

## 2018-03-27 DIAGNOSIS — S52572A Other intraarticular fracture of lower end of left radius, initial encounter for closed fracture: Secondary | ICD-10-CM

## 2018-03-27 DIAGNOSIS — R7989 Other specified abnormal findings of blood chemistry: Secondary | ICD-10-CM

## 2018-03-27 DIAGNOSIS — R454 Irritability and anger: Secondary | ICD-10-CM | POA: Diagnosis not present

## 2018-03-27 MED ORDER — MELATONIN 5 MG PO TABS: 1.0000 | ORAL_TABLET | Freq: Every day | ORAL | 0 refills | Status: DC

## 2018-03-27 NOTE — Progress Notes (Signed)
BP 140/82 (BP Location: Right Arm, Patient Position: Sitting, Cuff Size: Normal)   Pulse 69   Temp 98.6 F (37 C) (Oral)   Ht 5' 2.75" (1.594 m)   Wt 119 lb 8 oz (54.2 kg)   SpO2 98%   BMI 21.34 kg/m    CC: hosp f/u visit Subjective:    Patient ID: Erika Weiss, female    DOB: [birth year], 84 y.o.   MRN: [id number]  HPI: Erika Weiss is a 84 y.o. female presenting on 03/27/2018 for Hospitalization Follow-up (Pt c/o numbess in right hand since the fall. Pt accompanied by her daughters, Armando ReichertMarian and Stanton KidneyDebra. )   Recent hospitalization after unwitnessed fall suffered at home, found by neighbor. Pt did not know why she was outside. She had bruising around her left eye and swelling of left forehead as well as L radial fracture. She was found to have elevated TnI to 0.05 - attributed to demand ischemia. Fracture treated with splint, no surgical intervention recommended. She received IV rocephin the oral cipro (3d course) for possible UTI (UCx not sent). Metoprolol was stopped due to bradycardia. Head CT, neck CT, knee xrays and wrist xrays reviewed. L wrist: Comminuted, displaced intra-articular distal left radial fracture.  Since fall, family has stayed with pt and husband 24/7 for constant supervision.  Sundowning in evenings.  Also notes R hand numbness.  Home BP readings have been stable off metoprolol.   [facility] involved - SN, PT, OT, nurse aide.   DATE OF ADMISSION:  03/16/2018   DATE OF DISCHARGE: 03/17/2018 TCM hospital f/u phone call completed 03/20/2018  D/C diagnosis: Active Problems:   Elevated troponin Accidental fall Left radius fracture Gait instability Ambulatory dysfunction Acute cystitis  Relevant past medical, surgical, family and social history reviewed and updated as indicated. Interim medical history since our last visit reviewed. Allergies and medications reviewed and updated. D/C medication list reconciled with current home  medications Outpatient Medications Prior to Visit  Medication Sig Dispense Refill  . amLODipine (NORVASC) 10 MG tablet TAKE 1 TABLET DAILY (Patient taking differently: Take 10 mg by mouth daily. ) 90 tablet 0  . cyanocobalamin 500 MCG tablet Take 500 mcg by mouth daily.    Marland Kitchen. donepezil (ARICEPT) 10 MG tablet TAKE 1 TABLET AT BEDTIME (Patient taking differently: Take 10 mg by mouth at bedtime. ) 90 tablet 0  . LORazepam (ATIVAN) 0.5 MG tablet TAKE 1/2 TO 1 TABLET BY MOUTH TWICE DAILY AS NEEDED FOR ANXIETY. 40 tablet 1  . magnesium hydroxide (MILK OF MAGNESIA) 400 MG/5ML suspension Take by mouth as needed for mild constipation.    . memantine (NAMENDA) 10 MG tablet TAKE 1 TABLET DAILY (Patient taking differently: Take 10 mg by mouth daily. ) 90 tablet 3  . naproxen sodium (ANAPROX) 220 MG tablet Take 220 mg by mouth as needed (pain).     Marland Kitchen. sertraline (ZOLOFT) 100 MG tablet Take 1 tablet (100 mg total) by mouth daily. 30 tablet 6  . oxyCODONE (OXY IR/ROXICODONE) 5 MG immediate release tablet Take 1-2 tablets (5-10 mg total) by mouth every 6 (six) hours as needed for moderate pain or severe pain. (Patient not taking: Reported on 03/27/2018) 15 tablet 0   No facility-administered medications prior to visit.      Per HPI unless specifically indicated in ROS section below Review of Systems     Objective:    BP 140/82 (BP Location: Right Arm, Patient Position: Sitting, Cuff Size: Normal)   Pulse  69   Temp 98.6 F (37 C) (Oral)   Ht 5' 2.75" (1.594 m)   Wt 119 lb 8 oz (54.2 kg)   SpO2 98%   BMI 21.34 kg/m   Wt Readings from Last 3 Encounters:  03/27/18 119 lb 8 oz (54.2 kg)  03/16/18 116 lb (52.6 kg)  02/09/18 120 lb 8 oz (54.7 kg)    Physical Exam  Constitutional: She appears well-developed and well-nourished. No distress.  HENT:  Mouth/Throat: Oropharynx is clear and moist. No oropharyngeal exudate.  Eyes: Pupils are equal, round, and reactive to light. EOM are normal.  Neck: Normal  range of motion. Neck supple. No thyromegaly present.  Cardiovascular: Normal rate, regular rhythm and normal heart sounds.  No murmur heard. Pulmonary/Chest: Effort normal and breath sounds normal. No respiratory distress. She has no wheezes. She has no rales.  Musculoskeletal: She exhibits no edema.  L arm in splint, mild edema of hand - per family much improved R shoulder FROM without pain, no pain with palpation of neck  Lymphadenopathy:    She has no cervical adenopathy.  Neurological: She is alert.  R hand - grip strength intact, sensation intact to light touch and temperature  Psychiatric: She has a normal mood and affect.  Nursing note and vitals reviewed.  Results for orders placed or performed during the hospital encounter of 03/16/18  CBC with Differential/Platelet  Result Value Ref Range   WBC 14.6 (H) 4.0 - 10.5 K/uL   RBC 4.45 3.87 - 5.11 MIL/uL   Hemoglobin 13.1 12.0 - 15.0 g/dL   HCT 09.8 11.9 - 14.7 %   MCV 90.1 80.0 - 100.0 fL   MCH 29.4 26.0 - 34.0 pg   MCHC 32.7 30.0 - 36.0 g/dL   RDW 82.9 56.2 - 13.0 %   Platelets 238 150 - 400 K/uL   nRBC 0.0 0.0 - 0.2 %   Neutrophils Relative % 86 %   Neutro Abs 12.7 (H) 1.7 - 7.7 K/uL   Lymphocytes Relative 5 %   Lymphs Abs 0.7 0.7 - 4.0 K/uL   Monocytes Relative 7 %   Monocytes Absolute 1.0 0.1 - 1.0 K/uL   Eosinophils Relative 1 %   Eosinophils Absolute 0.1 0.0 - 0.5 K/uL   Basophils Relative 0 %   Basophils Absolute 0.1 0.0 - 0.1 K/uL   Immature Granulocytes 1 %   Abs Immature Granulocytes 0.11 (H) 0.00 - 0.07 K/uL  Comprehensive metabolic panel  Result Value Ref Range   Sodium 144 135 - 145 mmol/L   Potassium 4.3 3.5 - 5.1 mmol/L   Chloride 107 98 - 111 mmol/L   CO2 24 22 - 32 mmol/L   Glucose, Bld 135 (H) 70 - 99 mg/dL   BUN 41 (H) 8 - 23 mg/dL   Creatinine, Ser 8.65 (H) 0.44 - 1.00 mg/dL   Calcium 9.8 8.9 - 78.4 mg/dL   Total Protein 7.9 6.5 - 8.1 g/dL   Albumin 4.5 3.5 - 5.0 g/dL   AST 35 15 - 41 U/L    ALT 18 0 - 44 U/L   Alkaline Phosphatase 72 38 - 126 U/L   Total Bilirubin 0.9 0.3 - 1.2 mg/dL   GFR calc non Af Amer 31 (L) >60 mL/min   GFR calc Af Amer 36 (L) >60 mL/min   Anion gap 13 5 - 15  Troponin I - ONCE - STAT  Result Value Ref Range   Troponin I 0.03 (HH) <0.03 ng/mL  Troponin  I - Once-Timed  Result Value Ref Range   Troponin I 0.05 (HH) <0.03 ng/mL  Urinalysis, Complete w Microscopic  Result Value Ref Range   Color, Urine YELLOW (A) YELLOW   APPearance HAZY (A) CLEAR   Specific Gravity, Urine 1.010 1.005 - 1.030   pH 6.0 5.0 - 8.0   Glucose, UA NEGATIVE NEGATIVE mg/dL   Hgb urine dipstick MODERATE (A) NEGATIVE   Bilirubin Urine NEGATIVE NEGATIVE   Ketones, ur NEGATIVE NEGATIVE mg/dL   Protein, ur 30 (A) NEGATIVE mg/dL   Nitrite NEGATIVE NEGATIVE   Leukocytes, UA MODERATE (A) NEGATIVE   RBC / HPF 0-5 0 - 5 RBC/hpf   WBC, UA 6-10 0 - 5 WBC/hpf   Bacteria, UA RARE (A) NONE SEEN   Squamous Epithelial / LPF 0-5 0 - 5   Mucus PRESENT    Hyaline Casts, UA PRESENT    Amorphous Crystal PRESENT   Troponin I - Now Then Q6H  Result Value Ref Range   Troponin I 0.05 (HH) <0.03 ng/mL  Troponin I - Now Then Q6H  Result Value Ref Range   Troponin I 0.06 (HH) <0.03 ng/mL  Troponin I - Now Then Q6H  Result Value Ref Range   Troponin I 0.05 (HH) <0.03 ng/mL  Basic metabolic panel  Result Value Ref Range   Sodium 142 135 - 145 mmol/L   Potassium 3.5 3.5 - 5.1 mmol/L   Chloride 106 98 - 111 mmol/L   CO2 28 22 - 32 mmol/L   Glucose, Bld 146 (H) 70 - 99 mg/dL   BUN 32 (H) 8 - 23 mg/dL   Creatinine, Ser 1.30 0.44 - 1.00 mg/dL   Calcium 9.0 8.9 - 86.5 mg/dL   GFR calc non Af Amer 50 (L) >60 mL/min   GFR calc Af Amer 57 (L) >60 mL/min   Anion gap 8 5 - 15  CBC  Result Value Ref Range   WBC 13.2 (H) 4.0 - 10.5 K/uL   RBC 4.31 3.87 - 5.11 MIL/uL   Hemoglobin 12.8 12.0 - 15.0 g/dL   HCT 78.4 69.6 - 29.5 %   MCV 90.3 80.0 - 100.0 fL   MCH 29.7 26.0 - 34.0 pg   MCHC  32.9 30.0 - 36.0 g/dL   RDW 28.4 13.2 - 44.0 %   Platelets 212 150 - 400 K/uL   nRBC 0.0 0.0 - 0.2 %  Type and screen Ordered by PROVIDER DEFAULT  Result Value Ref Range   ABO/RH(D) A POS    Antibody Screen NEG    Sample Expiration      03/19/2018 Performed at [facility], [address] [place], [place] [address]       Assessment & Plan:   Problem List Items Addressed This Visit    Irritability and anger    Continue sertraline 100mg  daily.  H/o arguments with husband as she doesn't remember who he is at times.       Hypertension    Stable readings off metoprolol. Will stay off beta blocker, continue amlodipine 10mg        Fall with significant injury    Unwitnessed, unclear story - pt does not remember details due to dementia. Will ask for evaluation by SW/CM for increase in level of care need.       Elevated troponin    Did not trend up - attributed to demand ischemia.       Dementia (HCC) - Primary    Continue donepezil and  namenda.  Suggested re-try melatonin for sleep. Using ativan for sundowning. Discussed using sparingly if able.  This is not her first fall with injury. Discussed concerns with safety to stay in current living situation at home as well as other options including ALF vs other for patient and husband. Family states they have decided to have someone stay with parents 24/7 for closer surveillance however they are interested in having a social worker/case manager come out to the house to review possible placement options for future. I will see if [facility] will send case manager or SW to house to further evaluate level of care need/safety at home.       Closed left radial fracture    Splint in place. Swelling has decreased.  They will call ortho to schedule cast placement. Appreciate ortho care.           Meds ordered this encounter  Medications  . Melatonin 5 MG TABS    Sig: Take 1 tablet (5 mg total) by mouth at bedtime.    Refill:  0    No orders of the defined types were placed in this encounter.   Follow up plan: Return in about 4 weeks (around 04/24/2018), or if symptoms worsen or fail to improve, for follow up visit.  Eustaquio Boyden, MD

## 2018-03-27 NOTE — Patient Instructions (Addendum)
Start colace regularly for stool softener.  I will ask case manager to come out to the house for evaluation of options.  Stay off metoprolol.  Continue tylenol for pain, limit oxycodone.  Return in 1 month for follow up visit.

## 2018-03-28 ENCOUNTER — Telehealth: Payer: Self-pay

## 2018-03-28 DIAGNOSIS — S5292XA Unspecified fracture of left forearm, initial encounter for closed fracture: Secondary | ICD-10-CM | POA: Insufficient documentation

## 2018-03-28 DIAGNOSIS — W19XXXA Unspecified fall, initial encounter: Secondary | ICD-10-CM | POA: Insufficient documentation

## 2018-03-28 NOTE — Assessment & Plan Note (Signed)
Unwitnessed, unclear story - pt does not remember details due to dementia. Will ask for evaluation by SW/CM for increase in level of care need.

## 2018-03-28 NOTE — Telephone Encounter (Signed)
Per Dr. Reece AgarG, fax[facility] 03/27/18 [facility] at [phone].    [facility] with [facility] at [phone] asking if they have Child psychotherapistsocial worker services.  Confirmed they do.  Says to fax order for services requested.

## 2018-03-28 NOTE — Assessment & Plan Note (Signed)
Stable readings off metoprolol. Will stay off beta blocker, continue amlodipine 10mg 

## 2018-03-28 NOTE — Telephone Encounter (Signed)
Thank you :)

## 2018-03-28 NOTE — Assessment & Plan Note (Signed)
Did not trend up - attributed to demand ischemia.

## 2018-03-28 NOTE — Assessment & Plan Note (Signed)
Continue sertraline 100mg  daily.  H/o arguments with husband as she doesn't remember who he is at times.

## 2018-03-28 NOTE — Assessment & Plan Note (Signed)
Continue donepezil and namenda.  Suggested re-try melatonin for sleep. Using ativan for sundowning. Discussed using sparingly if able.  This is not her first fall with injury. Discussed concerns with safety to stay in current living situation at home as well as other options including ALF vs other for patient and husband. Family states they have decided to have someone stay with parents 24/7 for closer surveillance however they are interested in having a social worker/case manager come out to the house to review possible placement options for future. [facility] or SW to house to further evaluate level of care need/safety at home.

## 2018-03-28 NOTE — Telephone Encounter (Signed)
Error

## 2018-03-28 NOTE — Assessment & Plan Note (Addendum)
Splint in place. Swelling has decreased.  They will call ortho to schedule cast placement. Appreciate ortho care.

## 2018-03-29 ENCOUNTER — Telehealth: Payer: Self-pay | Admitting: Family Medicine

## 2018-03-29 NOTE — Telephone Encounter (Signed)
Michelle/[facility] called office to request verbal order for medical social worker evaluation for care giver support for 1 week 1. Best cb # [phone]

## 2018-03-29 NOTE — Telephone Encounter (Signed)
Pt's daughter (Erika Weiss) called office stating she spoke with Dr.G recently about getting help through the veterans [employer]. She contacted them and they said Dr.G would need to contact them and fill out some information. She is requesting a call back from Dr.G's nurse to speak more in detail about the process. °

## 2018-03-29 NOTE — Telephone Encounter (Signed)
Spoke with pt's daughter, Stanton KidneyDebra (on dpr), about [place] from [place].  Says she is trying to get things set up for pt's husband, since he is the veteran.  Pt would just reap the benefits if something was set up for the husband.

## 2018-03-30 NOTE — Telephone Encounter (Signed)
Left message for Marcelino DusterMichelle, [facility], informing her Dr. Reece AgarG is giving verbal orders for services requested for pt.

## 2018-03-30 NOTE — Telephone Encounter (Signed)
Agree with this.  

## 2018-04-08 DIAGNOSIS — F0390 Unspecified dementia without behavioral disturbance: Secondary | ICD-10-CM

## 2018-04-08 DIAGNOSIS — I10 Essential (primary) hypertension: Secondary | ICD-10-CM

## 2018-04-08 DIAGNOSIS — S52572D Other intraarticular fracture of lower end of left radius, subsequent encounter for closed fracture with routine healing: Secondary | ICD-10-CM

## 2018-04-08 DIAGNOSIS — N3000 Acute cystitis without hematuria: Secondary | ICD-10-CM | POA: Diagnosis not present

## 2018-04-08 DIAGNOSIS — M1990 Unspecified osteoarthritis, unspecified site: Secondary | ICD-10-CM

## 2018-04-09 ENCOUNTER — Telehealth: Payer: Self-pay | Admitting: *Deleted

## 2018-04-09 NOTE — Telephone Encounter (Signed)
Spoke to [place]ole with [facility] who states she had a visit with pt and her BP was 170/80. She has no additional complaints or s/s but [facility] policy requires them to notify PCP. Pt had advised Joni Reiningicole she had not had anything to drink today. Pt was hydrated with water and rechecked and BP reading was still 170/80. Wanted to send to Dr Reece AgarG as a FYI to see if any additional action is required. pls advise

## 2018-04-10 NOTE — Telephone Encounter (Signed)
Ensure she's taking amlodipine 10mg  daily. Would check bp daily for next several days and call back with readings.

## 2018-04-12 NOTE — Telephone Encounter (Signed)
Left message on vm for [place], of [facility], relaying Dr. Timoteo ExposeG's instructions.

## 2018-04-19 ENCOUNTER — Other Ambulatory Visit: Payer: Self-pay | Admitting: Family Medicine

## 2018-04-23 ENCOUNTER — Encounter: Payer: Self-pay | Admitting: Family Medicine

## 2018-04-23 NOTE — Progress Notes (Signed)
BP 138/76 (BP Location: Right Arm, Patient Position: Sitting, Cuff Size: Normal)   Pulse 73   Temp 98.4 F (36.9 C) (Oral)   Ht 5' 2.75" (1.594 m)   Wt 119 lb 8 oz (54.2 kg)   SpO2 97%   BMI 21.34 kg/m    CC: 1 mo f/u visit Subjective:    Patient ID: Erika Weiss, female    DOB: 1924/05/22, 84 y.o.   MRN: [id number]  HPI: Erika Weiss is a 84 y.o. female presenting on 04/24/2018 for Follow-up (Here for 4 wk f/u.)   See prior note for details - seen earlier this month after fall at home with resultant L comminuted radial fracture treated with splint and ortho f/u. Received abx for possible UTI. Metoprolol was stopped due to bradycardia. L arm cast in place through 05/02/2017 when ortho f/u next scheduled. Now off narcotic, with PRN aleve.   There was concern for patient staying at home alone with husband, as both suffer from dementia. Throughout this month, family has stayed with pt/husband 24/7 for constant supervision. We also asked [facility] to come out to the house including Child psychotherapistsocial worker. This has completed. They continue 24/7 family supervision.   Dementia - continues donepezil 10mg  and namenda 10mg  bid.  Some sundowning noted - we restarted melatonin. Ativan was tried in the hospital.   Continues sertraline 100mg  for irritability/anger with benefit.   HTN - isolated home BP reading 170/80 (04/09/2018). Good control at home since then.   Constipation - takes colace daily with MOM PRN.      Relevant past medical, surgical, family and social history reviewed and updated as indicated. Interim medical history since our last visit reviewed. Allergies and medications reviewed and updated. Outpatient Medications Prior to Visit  Medication Sig Dispense Refill  . amLODipine (NORVASC) 10 MG tablet TAKE 1 TABLET DAILY 90 tablet 3  . cyanocobalamin 500 MCG tablet Take 500 mcg by mouth daily.    Marland Kitchen. donepezil (ARICEPT) 10 MG tablet TAKE 1 TABLET AT BEDTIME (Patient taking  differently: Take 10 mg by mouth at bedtime. ) 90 tablet 0  . LORazepam (ATIVAN) 0.5 MG tablet Take 1 tablet (0.5 mg total) by mouth at bedtime.    . magnesium hydroxide (MILK OF MAGNESIA) 400 MG/5ML suspension Take by mouth as needed for mild constipation.    . Melatonin 5 MG TABS Take 1 tablet (5 mg total) by mouth at bedtime.  0  . memantine (NAMENDA) 10 MG tablet TAKE 1 TABLET DAILY (Patient taking differently: Take 10 mg by mouth daily. ) 90 tablet 3  . naproxen sodium (ANAPROX) 220 MG tablet Take 220 mg by mouth as needed (pain).     Marland Kitchen. sertraline (ZOLOFT) 100 MG tablet Take 1 tablet (100 mg total) by mouth daily. 30 tablet 6  . LORazepam (ATIVAN) 0.5 MG tablet TAKE 1/2 TO 1 TABLET BY MOUTH TWICE DAILY AS NEEDED FOR ANXIETY. 40 tablet 1  . oxyCODONE (OXY IR/ROXICODONE) 5 MG immediate release tablet Take 1-2 tablets (5-10 mg total) by mouth every 6 (six) hours as needed for moderate pain or severe pain. (Patient not taking: Reported on 03/27/2018) 15 tablet 0   No facility-administered medications prior to visit.      Per HPI unless specifically indicated in ROS section below Review of Systems Objective:    BP 138/76 (BP Location: Right Arm, Patient Position: Sitting, Cuff Size: Normal)   Pulse 73   Temp 98.4 F (36.9 C) (Oral)  Ht 5' 2.75" (1.594 m)   Wt 119 lb 8 oz (54.2 kg)   SpO2 97%   BMI 21.34 kg/m   Wt Readings from Last 3 Encounters:  04/24/18 119 lb 8 oz (54.2 kg)  03/27/18 119 lb 8 oz (54.2 kg)  03/16/18 116 lb (52.6 kg)    Physical Exam Vitals signs and nursing note reviewed.  Constitutional:      General: She is not in acute distress.    Appearance: Normal appearance.  HENT:     Mouth/Throat:     Mouth: Mucous membranes are moist.     Pharynx: Oropharynx is clear.  Cardiovascular:     Rate and Rhythm: Normal rate and regular rhythm.     Pulses: Normal pulses.     Heart sounds: Murmur (3/6 systolic) present.  Pulmonary:     Effort: Pulmonary effort is  normal. No respiratory distress.     Breath sounds: Normal breath sounds. No wheezing, rhonchi or rales.  Musculoskeletal:     Right lower leg: No edema.     Left lower leg: No edema.     Comments: LUE with cast in place  Neurological:     Mental Status: She is alert.  Psychiatric:        Mood and Affect: Mood normal.       Assessment & Plan:   Problem List Items Addressed This Visit    Hypertension    Stable period only on amlodipine 10mg  daily - continue. I did ask them to monitor BP at least weekly, check more frequently if high reading, and let me know if consistently elevated.      Dementia (HCC) - Primary    Completed HH OT, SN, SW evals.  Stable period with family present 24/7 for supervision. Encouraged they continue this care.  Continue donepezil, namenda, sertraline. Daughter declines higher namenda dose.  Doing well with ativan, melatonin nightly for sleep.  RTC 4 mo f/u visit.       Relevant Medications   LORazepam (ATIVAN) 0.5 MG tablet   Closed left radial fracture    Followed by ortho, cast in place.           No orders of the defined types were placed in this encounter.  No orders of the defined types were placed in this encounter.   Follow up plan: Return in about 4 months (around 08/23/2018) for follow up visit.  Eustaquio BoydenJavier Johngabriel Verde, MD

## 2018-04-24 ENCOUNTER — Ambulatory Visit (INDEPENDENT_AMBULATORY_CARE_PROVIDER_SITE_OTHER): Payer: Medicare Other | Admitting: Family Medicine

## 2018-04-24 ENCOUNTER — Encounter: Payer: Self-pay | Admitting: Family Medicine

## 2018-04-24 VITALS — BP 138/76 | HR 73 | Temp 98.4°F | Ht 62.75 in | Wt 119.5 lb

## 2018-04-24 DIAGNOSIS — S52572D Other intraarticular fracture of lower end of left radius, subsequent encounter for closed fracture with routine healing: Secondary | ICD-10-CM

## 2018-04-24 DIAGNOSIS — F0391 Unspecified dementia with behavioral disturbance: Secondary | ICD-10-CM | POA: Diagnosis not present

## 2018-04-24 DIAGNOSIS — I10 Essential (primary) hypertension: Secondary | ICD-10-CM | POA: Diagnosis not present

## 2018-04-24 NOTE — Assessment & Plan Note (Signed)
Stable period only on amlodipine 10mg  daily - continue. I did ask them to monitor BP at least weekly, check more frequently if high reading, and let me know if consistently elevated.

## 2018-04-24 NOTE — Assessment & Plan Note (Addendum)
Completed HH OT, SN, SW evals.  Stable period with family present 24/7 for supervision. Encouraged they continue this care.  Continue donepezil, namenda, sertraline. Daughter declines higher namenda dose.  Doing well with ativan, melatonin nightly for sleep.  RTC 4 mo f/u visit.

## 2018-04-24 NOTE — Patient Instructions (Addendum)
Try to check blood pressures weekly.  If reading returns high, start checking more frequently and if staying high, let me know.  May cancel January appointment up front, reschedule for April follow up visit.

## 2018-04-24 NOTE — Assessment & Plan Note (Signed)
Followed by ortho, cast in place.

## 2018-04-30 ENCOUNTER — Other Ambulatory Visit: Payer: Self-pay | Admitting: Family Medicine

## 2018-04-30 NOTE — Telephone Encounter (Signed)
Eprescribed.

## 2018-04-30 NOTE — Telephone Encounter (Signed)
Name of Medication: Lorazepam Name of Pharmacy: [facility] Last Fill or Written Date and Quantity: 03/19/18, #40/0 Last Office Visit and Type: 04/24/18, f/u Next Office Visit and Type: 08/13/18, 3 mo f/u Last Controlled Substance Agreement Date: none Last UDS: none

## 2018-05-04 ENCOUNTER — Other Ambulatory Visit: Payer: Self-pay

## 2018-05-04 MED ORDER — SERTRALINE HCL 100 MG PO TABS: 100.0000 mg | ORAL_TABLET | Freq: Every day | ORAL | 1 refills | Status: DC

## 2018-05-04 NOTE — Telephone Encounter (Signed)
Received 90-day rx request for sertraline.  E-scribed refill.

## 2018-05-10 ENCOUNTER — Telehealth: Payer: Self-pay

## 2018-05-10 NOTE — Telephone Encounter (Signed)
Spoke with pt's daughter, [name] (on dpr), at [phone].  States pt had cast removed and c/o arm tenderness.  [name] is asking would the pt need therapy for the tenderness.

## 2018-05-10 NOTE — Telephone Encounter (Signed)
No need for therapy specifically for pain, unless ortho wanted her to have therapy. Would try heating pad (covered with towel for 5-10 min at a time) or asper cream to arm, if pain not improving would rec f/u with ortho or here.

## 2018-05-11 NOTE — Telephone Encounter (Signed)
Spoke with pt's daughter, [name] (on dpr), relaying Dr. Timoteo Expose message. Says they have been doing all of his recommendations and pt is still complaining. Says they will contact ortho.

## 2018-05-15 ENCOUNTER — Ambulatory Visit: Payer: Medicare Other | Admitting: Family Medicine

## 2018-07-02 ENCOUNTER — Telehealth (INDEPENDENT_AMBULATORY_CARE_PROVIDER_SITE_OTHER): Payer: Medicare Other | Admitting: *Deleted

## 2018-07-02 DIAGNOSIS — R8290 Unspecified abnormal findings in urine: Secondary | ICD-10-CM

## 2018-07-02 DIAGNOSIS — R4182 Altered mental status, unspecified: Secondary | ICD-10-CM

## 2018-07-02 DIAGNOSIS — R350 Frequency of micturition: Secondary | ICD-10-CM | POA: Diagnosis not present

## 2018-07-02 NOTE — Telephone Encounter (Signed)
Patient's daughter (on [place]) called stating that patient has dementia and her anxiety is getting worse. Erika Weiss stated that she is not wanting to go to bed, not sleeping,  getting violent towards her dad and trying to leave the house.  Erika Weiss wants to know if you can give her something to calm her down? Pharmacy-[facility]

## 2018-07-03 MED ORDER — MEMANTINE HCL ER 14 MG PO CP24: 15.0000 mg | ORAL_CAPSULE | Freq: Every day | ORAL | 1 refills | Status: DC

## 2018-07-03 NOTE — Telephone Encounter (Signed)
Attempted to return Debra's call.  Left message on vm for her to call back, if she can before 5:00 PM.  If not, I will call her back tomorrow.

## 2018-07-03 NOTE — Telephone Encounter (Signed)
Pt returned your call and is requesting a cb °

## 2018-07-03 NOTE — Telephone Encounter (Signed)
Attempted to call pt's daughter, [name] (on dpr). She could barely hear me due to bad connection. Says she will call back.

## 2018-07-03 NOTE — Telephone Encounter (Signed)
How is ativan helping? Are they using full tablet regularly at night?  Would offer extended release namenda hopeful for better effect at night time. Try this in place of 10mg  once daily.  We could try seroquel at bedtime but this medicine can be risky in elderly population with dementia. Would probably recommend OV to review risk before we started this.   Also could try music therapy (calming or classical music at night) or aromatherapy (lemon balm or lavender oil) or gentle massage to help with night time relaxation.

## 2018-07-04 NOTE — Telephone Encounter (Signed)
Spoke with pt's daughter, [name], relaying Dr. Timoteo Expose message. She verbalizes understanding. States the pharmacy called and told her the rx is over $100 with insurance. So she is asking if there is a generic or something else to try.

## 2018-07-04 NOTE — Telephone Encounter (Signed)
Try namenda first. Then call us with update in 1 wk and we can consider seroquel at that time. Ok to pick up urine cup, review clean catch technique and if collected needs to keep refrigerated until brought in.

## 2018-07-04 NOTE — Telephone Encounter (Signed)
Spoke with pt's daughter, [name], relaying Dr. Timoteo Expose message.  Verbalizes understanding and agrees to try Namenda XR and Seroquel.    Also, [name] wants to know if pt can be checked for UTI.  She is having urinary frequency and had the same issue in the hospital, which she did have UTI. Says she can pick up a specimen cup. if Dr. Reece Agar is ok with that. Pt always has trouble "going" when here for OV.

## 2018-07-05 ENCOUNTER — Other Ambulatory Visit: Payer: Medicare Other

## 2018-07-05 ENCOUNTER — Other Ambulatory Visit: Payer: Self-pay

## 2018-07-05 DIAGNOSIS — R8290 Unspecified abnormal findings in urine: Secondary | ICD-10-CM

## 2018-07-05 LAB — POC URINALSYSI DIPSTICK (AUTOMATED)
BILIRUBIN UA: NEGATIVE
GLUCOSE UA: NEGATIVE
Ketones, UA: NEGATIVE
NITRITE UA: NEGATIVE
Protein, UA: POSITIVE — AB
Spec Grav, UA: 1.025 (ref 1.010–1.025)
Urobilinogen, UA: 0.2 E.U./dL
pH, UA: 6 (ref 5.0–8.0)

## 2018-07-05 MED ORDER — SULFAMETHOXAZOLE-TRIMETHOPRIM 800-160 MG PO TABS: 1.0000 | ORAL_TABLET | Freq: Two times a day (BID) | ORAL | 0 refills | Status: DC

## 2018-07-05 MED ORDER — MEMANTINE HCL 10 MG PO TABS: 10.0000 mg | ORAL_TABLET | Freq: Two times a day (BID) | ORAL | 1 refills | Status: DC

## 2018-07-05 MED ORDER — MEMANTINE HCL 5 MG PO TABS: ORAL_TABLET | ORAL | 0 refills | Status: DC

## 2018-07-05 NOTE — Telephone Encounter (Signed)
Spoke with pt's daughter, Erika Weiss, relaying Dr. Timoteo Expose instructions and message. Verbalizes understanding and expresses her thanks.

## 2018-07-05 NOTE — Addendum Note (Signed)
Addended by: Nanci Pina on: 07/05/2018 02:53 PM   Modules accepted: Orders

## 2018-07-05 NOTE — Telephone Encounter (Addendum)
Then let's just continue namenda but I'd like to have her take it twice daily and we will taper it up - take 10mg  in am and 5mg  in PM for 1 wk then increase to 10mg  twice daily. I have sent in 1 wk Rx for 5mg  for taper.  UA suspicious for infection - start bactrim DS twice daily for 5 days. Awaiting UCx.

## 2018-07-05 NOTE — Addendum Note (Signed)
Addended by: Eustaquio Boyden on: 07/05/2018 02:46 PM   Modules accepted: Orders

## 2018-07-05 NOTE — Addendum Note (Signed)
Addended by: Nanci Pina on: 07/05/2018 02:36 PM   Modules accepted: Orders

## 2018-07-06 NOTE — Telephone Encounter (Signed)
Female left v/m requesting cb from Misty Stanley; had additional questions about new med. New med was picked up and wants to know if pt should keep taking med was already taking with the new med. No further information given (no name of phone #.)

## 2018-07-06 NOTE — Telephone Encounter (Addendum)
Attempted to return call to pt's daughter, [name] (on dpr). No answer. Could not leave vm.    See Dr. Timoteo Expose message below to answer any questions.

## 2018-07-07 LAB — URINE CULTURE
MICRO NUMBER: 311704
SPECIMEN QUALITY: ADEQUATE

## 2018-07-09 ENCOUNTER — Telehealth: Payer: Self-pay | Admitting: Family Medicine

## 2018-07-09 NOTE — Telephone Encounter (Signed)
Pt's daughter [name] is returning your call

## 2018-07-09 NOTE — Telephone Encounter (Signed)
Pt's daughter [name] is returning your cakl

## 2018-07-09 NOTE — Telephone Encounter (Signed)
Best number Erika Weiss) returned your call  Phone breaking up could notget phone number

## 2018-07-09 NOTE — Telephone Encounter (Signed)
Spoke with pt's daughter, [name], answering all questions concerning Dr. Timoteo Expose message.  Verbalizes understanding.  However, pt states she is seeing things moving. [name] is concerned.

## 2018-07-09 NOTE — Telephone Encounter (Addendum)
Left message for pt's daughter, [name] (on dpr) to call back. [Not sure if she was who left the vm on Fri, 07/06/18.]   See Dr. Timoteo Expose message below to answer any questions.

## 2018-07-10 NOTE — Telephone Encounter (Signed)
Spoke with Erika Weiss.  [See TE, 07/02/18.]

## 2018-07-16 ENCOUNTER — Other Ambulatory Visit: Payer: Self-pay | Admitting: Family Medicine

## 2018-07-16 MED ORDER — TRAZODONE HCL 50 MG PO TABS: 25.0000 mg | ORAL_TABLET | Freq: Every evening | ORAL | 1 refills | Status: DC | PRN

## 2018-07-16 NOTE — Telephone Encounter (Signed)
Pt's daughter, [name], is calling stating pt has finished Bactrim and is now taking Namenda 10 mg BID.  However, pt is now violent towards all family members, hallucinating and is saying weird thing. Asking for advice. They don't know what else to do.

## 2018-07-16 NOTE — Telephone Encounter (Signed)
See TE, 07/02/18.

## 2018-07-16 NOTE — Telephone Encounter (Signed)
Patient's daughter [name] left a voicemail stating that she is waiting on Leisa to call her back.

## 2018-07-16 NOTE — Addendum Note (Signed)
Addended by: Eustaquio Boyden on: 07/16/2018 05:47 PM   Modules accepted: Orders

## 2018-07-16 NOTE — Telephone Encounter (Addendum)
Spoke with Erika Weiss. They have been doing namenda 10mg  2 tab in am. Advised to split to 1 tab BID Will also trial trazodone 50mg  1/2-1 tab at night time for sleep. Discussed seroquel - not interested in this at this time.  To call us in 1-2 wks with update.

## 2018-07-16 NOTE — Telephone Encounter (Signed)
Name of Medication: Lorazepam Name of Pharmacy: [facility] Last Fill or Written Date and Quantity: 06/05/18, #40 Last Office Visit and Type: 2/31/19, f/u Next Office Visit and Type: 08/13/18, 3 mo f/u Last Controlled Substance Agreement Date: none Last UDS: none

## 2018-07-19 NOTE — Telephone Encounter (Signed)
E perscribed 

## 2018-08-02 ENCOUNTER — Other Ambulatory Visit: Payer: Medicare Other | Admitting: Student

## 2018-08-02 ENCOUNTER — Telehealth: Payer: Self-pay

## 2018-08-02 ENCOUNTER — Telehealth: Payer: Self-pay | Admitting: Student

## 2018-08-02 ENCOUNTER — Other Ambulatory Visit: Payer: Self-pay

## 2018-08-02 DIAGNOSIS — Z515 Encounter for palliative care: Secondary | ICD-10-CM

## 2018-08-02 MED ORDER — OLANZAPINE 2.5 MG PO TABS: 2.5000 mg | ORAL_TABLET | Freq: Every day | ORAL | 0 refills | Status: DC

## 2018-08-02 NOTE — Telephone Encounter (Signed)
Inbound call to triage - Gwinda Maine, NP, contacted office requesting a new medication for patient because of combative behavior in the evenings.  Zyprexa 2.5 mg by mouth daily at 6PM.  FYI: NP Rivers recommended to patient's family that her Melatonin dosage is increased to 10 mg at bedtime.   NP Rivers can be reached at [phone]

## 2018-08-02 NOTE — Telephone Encounter (Signed)
Spoke w/ Erika Weiss. I have sent low dose zyprexa in.  Family was previously hesitant to use antipsychotics for patient.  plz let family know this was called in. Would get update to see how it helps after first few days on med.

## 2018-08-02 NOTE — Telephone Encounter (Signed)
Phone call 5:16pm: received call from Dr. Patrice Paradise; he agrees with recommendation for olanzapine 2.5mg  daily. He is calling in order.

## 2018-08-02 NOTE — Progress Notes (Signed)
[facility] Consult Note Telephone: [phone]  Fax: [phone]  PATIENT NAME: Erika Weiss DOB: [birth year] MRN: [id number]  PRIMARY CARE PROVIDER:   Eustaquio BoydenGutierrez, Javier, MD  REFERRING PROVIDER:  Eustaquio BoydenGutierrez, Javier, MD [address] [place], [place] [id number]  RESPONSIBLE PARTY: Daughters Roslyn SmilingDoris Russll [phone], Patrina LeveringDebra Meckel [phone]    ASSESSMENT: Due to the COVID-19 crisis, this visit was done via telemedicine from my office and it was initiated and consent by this patient and or family. NP discussed patient with daughters Stanton KidneyDebra and Tyler AasDoris. [name] was able to answer questions, pleasant affect. No acute distress noted. FAST score 6D. We discussed ongoing goals of care. We also discussed higher level of care. Family would like to be able to keep both patient and her husband at home for as long as possible. They are interested in resources for outside help/assistance. Family expresses caregiver fatigue. We discussed symptom management; worsening agitation, "sundowning." Sleep difficulty also noted. Education provided on olanzapine; family would like to try medication. Dr. Patrice ParadiseGuiterrez notified with recommendation. We discussed code status; daughter is to look for patient's Living Will. Will fill out a MOST form on next visit. Family is open to hospitalizations as needed.     RECOMMENDATIONS and PLAN:  1. Code status: Full Code; daughter to find Living Will and will readdress code status.  2. Medical goals of therapy/symptom management: Anxiety/agitation: continue dementia medications as ordered. Administer lorazepam 0.5mg  BID prn. Provide redirection as needed; engage in music that she enjoys. Recommendations: start olanzapine 2.5mg  daily at 6pm; monitor for effectiveness; for sleep-increase melatonin to 10mg  qhs. Dr. Patrice ParadiseGuiterrez notified with recommendations; awaiting return call.  3. Discharge Planning: [name] will continue  to reside at home with support of family.   Referral made to Palliative SW to discuss long range planning, caregivers in the home, caregiver fatigue.   Palliative Medicine will follow up in 3 weeks or sooner, if needed.  I spent 40 minutes providing this consultation,  from 10:00am to 10:40am. More than 50% of the time in this consultation was spent coordinating communication.   HISTORY OF PRESENT ILLNESS:  Erika Weiss is a 85 y.o. female with multiple medical problems including dementia with behavioral disturbances, hypertension, hypercholesterolemia, hyperglycemia, osteoarthritis, systolic murmur, weight loss, hx of colon cancer. Palliative Care was asked to help address goals of care; she is seen for follow up visit today. [name] currently resides at home with her husband. [name] states " I feel pretty good today." They have 5 children, 4 live locally and they alternate and assist with them in the home 24/7. Daughters report worsening agitation, she is more combative and "sundowns" each evening. She directs her anger towards her husband. Daughters states she believes her husband is her father. She will also look for her twin daughters as they were children and also talks about having to go to work, when she has been retired for at least 20 years. She does have a good appetite; no recent weight loss reported. She is ambulatory with a cane; no recent falls reported. She denies pain, shortness of breath or constipation. She does have bladder incontinence; continent of bowel. Family prepares meals, medication box. She has lost interest in activities she used to participate in. She is not sleeping well. No recent infections reported. No hospitalizations.   CODE STATUS: Full Code  PPS: 50% HOSPICE ELIGIBILITY/DIAGNOSIS: TBD  PAST MEDICAL HISTORY:  Past Medical History:  Diagnosis Date  .  Finger wound, simple, open, subsequent encounter 01/24/2017  . Heart murmur new 2017?  Marland Kitchen History  of colon cancer   . HLD (hyperlipidemia)   . Hyperglycemia   . Hypertension   . Osteoarthritis    knees  . Varicose veins of lower extremities with other complications     SOCIAL HX:  Social History   Tobacco Use  . Smoking status: Former Smoker    Last attempt to quit: 04/25/1978    Years since quitting: 40.2  . Smokeless tobacco: Never Used  Substance Use Topics  . Alcohol use: No    Alcohol/week: 0.0 standard drinks    ALLERGIES: No Known Allergies   PERTINENT MEDICATIONS:  Outpatient Encounter Medications as of 08/02/2018  Medication Sig  . amLODipine (NORVASC) 10 MG tablet TAKE 1 TABLET DAILY  . cyanocobalamin 500 MCG tablet Take 500 mcg by mouth daily.  Marland Kitchen donepezil (ARICEPT) 10 MG tablet TAKE 1 TABLET AT BEDTIME (Patient taking differently: Take 10 mg by mouth at bedtime. )  . LORazepam (ATIVAN) 0.5 MG tablet TAKE 1/2 TO 1 TABLET BY MOUTH TWICE DAILY AS NEEDED FOR ANXIETY  . Melatonin 5 MG TABS Take 1 tablet (5 mg total) by mouth at bedtime.  . memantine (NAMENDA) 10 MG tablet Take 1 tablet (10 mg total) by mouth 2 (two) times daily.  . naproxen sodium (ANAPROX) 220 MG tablet Take 220 mg by mouth as needed (pain).   Marland Kitchen sertraline (ZOLOFT) 100 MG tablet Take 1 tablet (100 mg total) by mouth daily.  . traZODone (DESYREL) 50 MG tablet Take 0.5-1 tablets (25-50 mg total) by mouth at bedtime as needed for sleep.  . magnesium hydroxide (MILK OF MAGNESIA) 400 MG/5ML suspension Take by mouth as needed for mild constipation.   No facility-administered encounter medications on file as of 08/02/2018.     PHYSICAL EXAM:   General: NAD, thin Dementia FAST score 6D  Luella Cook, NP

## 2018-08-06 NOTE — Telephone Encounter (Signed)
Spoke with pt's daughter, Tyler Aas, relaying Dr. Timoteo Expose message. Verbalizes understanding and will call Thurs or Fri with an update. [Pt started med 08/02/18.]

## 2018-08-06 NOTE — Telephone Encounter (Signed)
Spoke with pt's daughter, Tyler Aas (on dpr), relaying Dr. Timoteo Expose message.  Says the med does not seem to be helping.  Pt is still combative, very disoriented, always looking for her children and is not sleeping well.

## 2018-08-06 NOTE — Telephone Encounter (Signed)
As long as symptoms not worse on zyprexa, continue for now. May take some time to take effect. Could consider increased dose if ineffective after 1 wk.

## 2018-08-09 ENCOUNTER — Telehealth: Payer: Self-pay

## 2018-08-09 NOTE — Telephone Encounter (Signed)
Inbound call to triage - patient's daughter reports new medication changes are not effective evidenced by recurring behaviors and insomnia in patient.   Please suggest an alternative treatment plan.

## 2018-08-10 NOTE — Telephone Encounter (Signed)
Spoke with pt, daughter, Tyler Aas (on dpr) relaying Dr. Timoteo Expose instructions.  Verbalizes understanding.  Says pt slept better last night.  [name] requested pt's MyChart signup request be emailed to pt's other daughter, [name] (on dpr) at [email or web address].   Sent email.

## 2018-08-10 NOTE — Telephone Encounter (Signed)
Take 2 zyprexa tablets at night (total of 5mg ) and let us know how she does after 1 week.

## 2018-08-13 ENCOUNTER — Ambulatory Visit (INDEPENDENT_AMBULATORY_CARE_PROVIDER_SITE_OTHER): Payer: Medicare Other | Admitting: Family Medicine

## 2018-08-13 ENCOUNTER — Encounter: Payer: Self-pay | Admitting: Family Medicine

## 2018-08-13 ENCOUNTER — Other Ambulatory Visit: Payer: Self-pay

## 2018-08-13 ENCOUNTER — Other Ambulatory Visit: Payer: Self-pay | Admitting: Family Medicine

## 2018-08-13 ENCOUNTER — Ambulatory Visit: Payer: Medicare Other | Admitting: Family Medicine

## 2018-08-13 VITALS — Ht 67.0 in

## 2018-08-13 VITALS — Ht 62.75 in

## 2018-08-13 DIAGNOSIS — R454 Irritability and anger: Secondary | ICD-10-CM

## 2018-08-13 DIAGNOSIS — F0391 Unspecified dementia with behavioral disturbance: Secondary | ICD-10-CM | POA: Diagnosis not present

## 2018-08-13 DIAGNOSIS — F05 Delirium due to known physiological condition: Secondary | ICD-10-CM | POA: Diagnosis not present

## 2018-08-13 DIAGNOSIS — I10 Essential (primary) hypertension: Secondary | ICD-10-CM

## 2018-08-13 NOTE — Assessment & Plan Note (Signed)
Continue sertraline.  100 mg daily  

## 2018-08-13 NOTE — Patient Instructions (Addendum)
Stop trazodone. Continue other medicines, with sparing use of lorazepam.  Try essential oil scent therapy (arometherapy) at night time lavender can be relaxing.  Try music therapy (ie classical music like Mozart's Eine kleine nachtmusik) in preparation for bedtime. Stick to a bedtime routine as much as feasible.

## 2018-08-13 NOTE — Telephone Encounter (Signed)
Aricept Last filled:  02/07/18, #90 Last OV:  Today, 08/13/18 Next OV:  none

## 2018-08-13 NOTE — Assessment & Plan Note (Addendum)
Stable period - continue aricept and namenda. Continues 24/7 family supervision. [facility] has established.

## 2018-08-13 NOTE — Assessment & Plan Note (Signed)
Continues amlodipine. They will check vitals and send through mychart.

## 2018-08-13 NOTE — Progress Notes (Signed)
Virtual visit completed through [employer]. Due to national recommendations of social distancing due to COVID 19, a virtual visit is felt to be most appropriate for this patient at this time.   Patient location: home. Daughter Tyler Aas is also on the call.  Provider location: Adult nurse at [facility], office If any vitals were documented, they were collected by patient at home unless specified below.    Ht 5' 2.75" (1.594 m)   BMI 21.34 kg/m    CC: 3 mo f/u visit Subjective:    Patient ID: Erika Weiss, female    DOB: 1924-12-15, 85 y.o.   MRN: [id number]  HPI: Erika R Countess is a 85 y.o. female presenting on 08/13/2018 for Follow-up (3 mo f/u.)   See recent phone notes and OV for details.  Established with [facility] palliative care NP Latoya (telehealth visit) who suggested zyprexa at night and family was in agreement. zyprexa 2.5mg  not effective to help control agitation, aggression so Friday we increased to 5mg  nightly. Melatonin was also increased to 10mg  nightly. SW referral placed.   Ongoing trouble with late bedtime 2-3am with agitation and confusion.   Ongoing caregiver fatigue. There is 24/7 family supervision.   Had fall at home with comminuted L radial fracture treated with splint.  Metoprolol previously stopped due to bradycardia.   Dementia with sundowning and behavioral changes - on donepezil 10mg  daily and namenda 10mg  bid. Continues sertraline 100mg  daily for irritability/anger with benefit.      Relevant past medical, surgical, family and social history reviewed and updated as indicated. Interim medical history since our last visit reviewed. Allergies and medications reviewed and updated. Outpatient Medications Prior to Visit  Medication Sig Dispense Refill  . amLODipine (NORVASC) 10 MG tablet TAKE 1 TABLET DAILY 90 tablet 3  . cyanocobalamin 500 MCG tablet Take 500 mcg by mouth daily.    Marland Kitchen donepezil (ARICEPT) 10 MG tablet TAKE 1 TABLET AT BEDTIME (Patient  taking differently: Take 10 mg by mouth at bedtime. ) 90 tablet 0  . LORazepam (ATIVAN) 0.5 MG tablet TAKE 1/2 TO 1 TABLET BY MOUTH TWICE DAILY AS NEEDED FOR ANXIETY 40 tablet 0  . magnesium hydroxide (MILK OF MAGNESIA) 400 MG/5ML suspension Take by mouth as needed for mild constipation.    . Melatonin 5 MG TABS Take 1 tablet (5 mg total) by mouth at bedtime.  0  . memantine (NAMENDA) 10 MG tablet Take 1 tablet (10 mg total) by mouth 2 (two) times daily. 180 tablet 1  . naproxen sodium (ANAPROX) 220 MG tablet Take 220 mg by mouth as needed (pain).     Marland Kitchen OLANZapine (ZYPREXA) 2.5 MG tablet Take 2 tablets (5 mg total) by mouth at bedtime. 30 tablet 0  . sertraline (ZOLOFT) 100 MG tablet Take 1 tablet (100 mg total) by mouth daily. 90 tablet 1  . traZODone (DESYREL) 50 MG tablet Take 0.5-1 tablets (25-50 mg total) by mouth at bedtime as needed for sleep. 30 tablet 1   No facility-administered medications prior to visit.      Per HPI unless specifically indicated in ROS section below Review of Systems Objective:    Ht 5' 2.75" (1.594 m)   BMI 21.34 kg/m   Wt Readings from Last 3 Encounters:  04/24/18 119 lb 8 oz (54.2 kg)  03/27/18 119 lb 8 oz (54.2 kg)  03/16/18 116 lb (52.6 kg)     Physical exam: Gen: alert, NAD, not ill appearing Pulm: speaks in complete sentences without  increased work of breathing Psych: normal mood, normal thought content       Assessment & Plan:  Over 25 minutes were spent face-to-face with the patient during this encounter and >50% of that time was spent on counseling and coordination of care  Problem List Items Addressed This Visit    Sundowning    Ongoing worsening symptoms at night time of agitation, insomnia, and at times aggressive behavior - despite melatonin, trazodone, lorazepam, sertraline and latest addition of olanzapine, now at 5mg  nightly. rec stop trazodone. Also discussed other supportive therapies such as aromatherapy and music therapy for  night time calming.       Irritability and anger    Continue sertraline 100mg  daily.       Hypertension    Continues amlodipine. They will check vitals and send through mychart.      Dementia (HCC) - Primary    Stable period - continue aricept and namenda. Continues 24/7 family supervision. [facility] has established.           No orders of the defined types were placed in this encounter.  No orders of the defined types were placed in this encounter.   Follow up plan: No follow-ups on file.  Eustaquio BoydenJavier Casin Federici, MD

## 2018-08-13 NOTE — Assessment & Plan Note (Signed)
Ongoing worsening symptoms at night time of agitation, insomnia, and at times aggressive behavior - despite melatonin, trazodone, lorazepam, sertraline and latest addition of olanzapine, now at 5mg  nightly. rec stop trazodone. Also discussed other supportive therapies such as aromatherapy and music therapy for night time calming.

## 2018-08-13 NOTE — Progress Notes (Signed)
Pt rescheduled to later in the day.

## 2018-08-27 ENCOUNTER — Other Ambulatory Visit: Payer: Self-pay | Admitting: Family Medicine

## 2018-08-28 ENCOUNTER — Other Ambulatory Visit: Payer: Self-pay | Admitting: Family Medicine

## 2018-08-28 NOTE — Telephone Encounter (Signed)
Name of Medication: Lorazepam Name of Pharmacy: [facility] Last Fill or Written Date and Quantity: 07/19/18, #40 Last Office Visit and Type: 08/13/18, f/u Next Office Visit and Type: none Last Controlled Substance Agreement Date: none Last UDS: none  Olanzapine last filled:  08/02/18, #30

## 2018-08-29 NOTE — Telephone Encounter (Signed)
Eprescribed.

## 2018-09-04 ENCOUNTER — Telehealth: Payer: Self-pay

## 2018-09-04 MED ORDER — MELATONIN 5 MG PO TABS: 2.0000 | ORAL_TABLET | Freq: Every day | ORAL | 0 refills | Status: AC

## 2018-09-04 NOTE — Telephone Encounter (Signed)
Left message for [name]-  Will try tomorrow.

## 2018-09-04 NOTE — Telephone Encounter (Signed)
Erika Weiss pts daughter left v/m requesting cb  I spoke with Erika Weiss (DPR signed) . Pt is not sleeping and staying up all night long; pt not wanting to take med or eat when she is like this; for last 2 night pt not sleeping. Pt will take very short naps (only minutes) during the day. 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 On April 20,2020 virtual visit meds for insomnia were adjusted but not helping. Erika Weiss request cb. Music and aromatherapy not helping with pt going to sleep. Please advise. [facility].

## 2018-09-05 MED ORDER — MIRTAZAPINE 15 MG PO TABS: 15.0000 mg | ORAL_TABLET | Freq: Every day | ORAL | 3 refills | Status: DC

## 2018-09-05 NOTE — Telephone Encounter (Signed)
Called and spoke with daughter Erika Weiss.  Has tried aromatherapy, music therapy without benefit.  Staying up all night, staying restless, worse after a very busy day. Mainly trouble falling asleep.  No nocturia. No GERD or stomach symptoms. No signs of RLS.  Will trial remeron 15mg  at night time. She will update [place] in 2 wks with effect.

## 2018-09-05 NOTE — Telephone Encounter (Signed)
[facility] Night - Client Nonclinical Telephone Record [facility] Medical Call Center Client [facility] Night - Client Client Site [facility] - Night Physician Eustaquio Boyden - MD Contact Type Call Who Is Calling Patient / Member / Family / Caregiver Caller Name Brittanny Limbaugh for Erika Weiss Caller Phone Number [phone] Call Type Message Only Information Provided Reason for Call Returning a Call from the Office Initial Comment Caller is returning a missed call. Additional Comment Call Closed By: Ardelle Anton Transaction Date/Time: 09/04/2018 5:12:53 PM (ET)

## 2018-09-24 ENCOUNTER — Other Ambulatory Visit: Payer: Self-pay | Admitting: Family Medicine

## 2018-09-24 NOTE — Telephone Encounter (Signed)
[name] walker (daughter) called needed to get rx sent to mail order [facility]   Best number [phone]  Mirtazapine Olanzapine memantine Lorazepam   Pt has enough for a little over week

## 2018-09-26 MED ORDER — MIRTAZAPINE 15 MG PO TABS: 15.0000 mg | ORAL_TABLET | Freq: Every day | ORAL | 0 refills | Status: DC

## 2018-09-26 MED ORDER — OLANZAPINE 5 MG PO TABS: 5.0000 mg | ORAL_TABLET | Freq: Every day | ORAL | 0 refills | Status: DC

## 2018-09-26 MED ORDER — MEMANTINE HCL 10 MG PO TABS: 10.0000 mg | ORAL_TABLET | Freq: Two times a day (BID) | ORAL | 0 refills | Status: DC

## 2018-09-26 MED ORDER — LORAZEPAM 0.5 MG PO TABS: 0.2500 mg | ORAL_TABLET | Freq: Two times a day (BID) | ORAL | 0 refills | Status: DC | PRN

## 2018-09-26 NOTE — Telephone Encounter (Signed)
Escribe failure. plz phone in locally and notify pt's daughter

## 2018-09-26 NOTE — Telephone Encounter (Signed)
Refill left on vm at pharmacy.  Left message on vm per dpr notifying Erika Weiss that Dr. Reece Agar sent lorazepam refill to [facility].

## 2018-09-26 NOTE — Telephone Encounter (Signed)
Name of Medication: Lorazepam Name of Pharmacy: [facility] Last Fill or Written Date and Quantity: 08/29/18, #40 Last Office Visit and Type: 08/13/18, f/u Next Office Visit and Type: none Last Controlled Substance Agreement Date: none Last UDS: none  Left message on vm per dpr informing Marion (on dpr) a 90-day rx for mirtazapine, olanzapine and memantine was sent to [facility].  Also, Dr. Reece Agar is working on lorazepam refill and not sure if it can be sent to [facility].

## 2018-10-01 ENCOUNTER — Telehealth: Payer: Self-pay | Admitting: Student

## 2018-10-01 NOTE — Telephone Encounter (Signed)
Palliative NP left message on home phone number, also tried to call daughter's phone number to set up follow up appointment to arrange follow up appointment.    

## 2018-10-10 ENCOUNTER — Other Ambulatory Visit: Payer: Medicare Other | Admitting: Student

## 2018-10-10 ENCOUNTER — Other Ambulatory Visit: Payer: Self-pay

## 2018-10-10 DIAGNOSIS — Z515 Encounter for palliative care: Secondary | ICD-10-CM

## 2018-10-10 NOTE — Progress Notes (Signed)
[place] Consult Note Telephone: [phone]  Fax: [phone]  PATIENT NAME: Erika Weiss DOB: [birth year] MRN: [id number]  PRIMARY CARE PROVIDER:   Ria Bush, MD  REFERRING PROVIDER:  Ria Bush, MD [place],  [place] [address]  RESPONSIBLE PARTY: Daughters Erika Weiss [phone], Erika Weiss [phone]      ASSESSMENT:  Erika Weiss is sitting in her living room upon arrival. She has pleasant affect; no acute distress noted. She is a FAST 6D. NP explained reason for visit; she welcomes visit. Daughters Erika Weiss and Erika Weiss are present. Discussed role of Palliative Medicine. We discussed ongoing goals of care and symptom management. Palliative Medicine will continue to provide ongoing support and make recommendations as needed. Family does express caregiver fatigue; SW referral made to follow up with family.      RECOMMENDATIONS and PLAN:  1. Code status not addressed this visit.  2. Medical goals of therapy/symptom management: Anxiety/agitation: continue zyprexa 5mg  each evening; continue lorazepam 0.5mg  BID prn. Essential oils, such as Lavender is recommended to help with anxiety as well as for sleep. Sleep-continue melatonin 10mg  QHS. Mild pedal edema: she is encouraged to elevate her feet while sitting or resting in bed. 3. Discharge Planning: Erika Weiss will continue to reside at home with husband and continued family support.   Palliative Medicine will follow up in 8 weeks or sooner, if needed.   I spent 25 minutes providing this consultation,  from 1:00pm to 1:25pm. More than 50% of the time in this consultation was spent coordinating communication.   HISTORY OF PRESENT ILLNESS:  Erika Weiss is a 85 y.o.  female with multiple medical problems including dementia with behavioral disturbances, hypertension, hypercholesterolemia, hyperglycemia, osteoarthritis, systolic murmur, weight loss,  hx of colon cancer. Palliative Care was asked to help address goals of care; she is seen for follow up visit today. Erika Weiss reports doing well. She denies pain, shortness of breath, nausea. She reports sleeping well at night. She states her appetite has been good. Family members alternate staying in the home with patient and her husband. They assist with medications and meals. Erika Weiss states patient's anxiety has improved in the evening. She does get confused at times and will occasionally try to leave the house. No falls reported. No recent infections. No ER visits or hospitalizations.   CODE STATUS: Full Code  PPS: 50% HOSPICE ELIGIBILITY/DIAGNOSIS: TBD  PAST MEDICAL HISTORY:  Past Medical History:  Diagnosis Date  . Finger wound, simple, open, subsequent encounter 01/24/2017  . Heart murmur new 2017?  Marland Kitchen History of colon cancer   . HLD (hyperlipidemia)   . Hyperglycemia   . Hypertension   . Osteoarthritis    knees  . Varicose veins of lower extremities with other complications     SOCIAL HX:  Social History   Tobacco Use  . Smoking status: Former Smoker    Quit date: 04/25/1978    Years since quitting: 40.4  . Smokeless tobacco: Never Used  Substance Use Topics  . Alcohol use: No    Alcohol/week: 0.0 standard drinks    ALLERGIES: No Known Allergies   PERTINENT MEDICATIONS:  Outpatient Encounter Medications as of 10/10/2018  Medication Sig  . amLODipine (NORVASC) 10 MG tablet TAKE 1 TABLET DAILY  . cyanocobalamin 500 MCG tablet Take 500 mcg by mouth daily.  Marland Kitchen donepezil (ARICEPT) 10 MG tablet TAKE 1 TABLET AT BEDTIME  . LORazepam (ATIVAN) 0.5 MG tablet Take 0.5-1  tablets (0.25-0.5 mg total) by mouth 2 (two) times daily as needed. for anxiety  . magnesium hydroxide (MILK OF MAGNESIA) 400 MG/5ML suspension Take by mouth as needed for mild constipation.  . Melatonin 5 MG TABS Take 2 tablets (10 mg total) by mouth at bedtime.  . memantine (NAMENDA) 10 MG tablet Take 1 tablet (10  mg total) by mouth 2 (two) times daily.  . mirtazapine (REMERON) 15 MG tablet Take 1 tablet (15 mg total) by mouth at bedtime.  . naproxen sodium (ANAPROX) 220 MG tablet Take 220 mg by mouth as needed (pain).   Marland Kitchen. OLANZapine (ZYPREXA) 5 MG tablet Take 1 tablet (5 mg total) by mouth at bedtime.  . sertraline (ZOLOFT) 100 MG tablet Take 1 tablet (100 mg total) by mouth daily.   No facility-administered encounter medications on file as of 10/10/2018.     PHYSICAL EXAM:   General: NAD, frail appearing, thin Cardiovascular: regular rate and rhythm Pulmonary: clear ant fields Abdomen: soft, nontender, + bowel sounds GU: no suprapubic tenderness Extremities: mild pedal edema Skin: no rashes Neurological: Weakness but otherwise nonfocal  Luella CookLaToya S Lijah Bourque, NP

## 2018-10-22 ENCOUNTER — Other Ambulatory Visit: Payer: Self-pay | Admitting: Family Medicine

## 2018-11-09 ENCOUNTER — Other Ambulatory Visit: Payer: Self-pay

## 2018-11-09 ENCOUNTER — Telehealth: Payer: Self-pay | Admitting: *Deleted

## 2018-11-09 ENCOUNTER — Other Ambulatory Visit: Payer: Self-pay | Admitting: Family Medicine

## 2018-11-09 ENCOUNTER — Ambulatory Visit (INDEPENDENT_AMBULATORY_CARE_PROVIDER_SITE_OTHER): Payer: Medicare Other | Admitting: Family Medicine

## 2018-11-09 ENCOUNTER — Encounter: Payer: Self-pay | Admitting: Family Medicine

## 2018-11-09 VITALS — BP 180/80 | HR 97 | Temp 98.4°F | Ht 62.75 in | Wt 116.4 lb

## 2018-11-09 DIAGNOSIS — R350 Frequency of micturition: Secondary | ICD-10-CM | POA: Diagnosis not present

## 2018-11-09 DIAGNOSIS — R51 Headache: Secondary | ICD-10-CM

## 2018-11-09 DIAGNOSIS — I10 Essential (primary) hypertension: Secondary | ICD-10-CM | POA: Diagnosis not present

## 2018-11-09 DIAGNOSIS — R011 Cardiac murmur, unspecified: Secondary | ICD-10-CM | POA: Diagnosis not present

## 2018-11-09 DIAGNOSIS — R519 Headache, unspecified: Secondary | ICD-10-CM

## 2018-11-09 LAB — CBC WITH DIFFERENTIAL/PLATELET
Basophils Absolute: 0.1 10*3/uL (ref 0.0–0.1)
Basophils Relative: 0.5 % (ref 0.0–3.0)
Eosinophils Absolute: 0 10*3/uL (ref 0.0–0.7)
Eosinophils Relative: 0.3 % (ref 0.0–5.0)
HCT: 41.2 % (ref 36.0–46.0)
Hemoglobin: 13.6 g/dL (ref 12.0–15.0)
Lymphocytes Relative: 13 % (ref 12.0–46.0)
Lymphs Abs: 1.3 10*3/uL (ref 0.7–4.0)
MCHC: 33 g/dL (ref 30.0–36.0)
MCV: 91.2 fl (ref 78.0–100.0)
Monocytes Absolute: 0.7 10*3/uL (ref 0.1–1.0)
Monocytes Relative: 7.1 % (ref 3.0–12.0)
Neutro Abs: 8.2 10*3/uL — ABNORMAL HIGH (ref 1.4–7.7)
Neutrophils Relative %: 79.1 % — ABNORMAL HIGH (ref 43.0–77.0)
Platelets: 236 10*3/uL (ref 150.0–400.0)
RBC: 4.52 Mil/uL (ref 3.87–5.11)
RDW: 14.3 % (ref 11.5–15.5)
WBC: 10.3 10*3/uL (ref 4.0–10.5)

## 2018-11-09 LAB — POC URINALSYSI DIPSTICK (AUTOMATED)
Bilirubin, UA: NEGATIVE
Blood, UA: NEGATIVE
Glucose, UA: NEGATIVE
Ketones, UA: NEGATIVE
Leukocytes, UA: NEGATIVE
Nitrite, UA: NEGATIVE
Protein, UA: NEGATIVE
Spec Grav, UA: 1.015 (ref 1.010–1.025)
Urobilinogen, UA: 0.2 E.U./dL
pH, UA: 6.5 (ref 5.0–8.0)

## 2018-11-09 LAB — BASIC METABOLIC PANEL
BUN: 19 mg/dL (ref 6–23)
CO2: 27 mEq/L (ref 19–32)
Calcium: 10 mg/dL (ref 8.4–10.5)
Chloride: 100 mEq/L (ref 96–112)
Creatinine, Ser: 0.98 mg/dL (ref 0.40–1.20)
GFR: 64.23 mL/min (ref >60.00)
Glucose, Bld: 122 mg/dL — ABNORMAL HIGH (ref 70–99)
Potassium: 3.9 mEq/L (ref 3.5–5.1)
Sodium: 138 mEq/L (ref 135–145)

## 2018-11-09 LAB — TSH: TSH: 1.34 u[IU]/mL (ref 0.35–4.50)

## 2018-11-09 MED ORDER — METOPROLOL SUCCINATE ER 25 MG PO TB24: 25.0000 mg | ORAL_TABLET | Freq: Every day | ORAL | 6 refills | Status: DC

## 2018-11-09 NOTE — Telephone Encounter (Signed)
Patient's daughter Erika Weiss called stating that her mom's blood pressure about an hour ago was 194/94 and about 30 minutes ago 189/94. Erika Weiss stated that she is complaining of neck pain and headache since last night. Erika Weiss stated that her mom is not eating much and is still in bed which is unusual for her. Erika Weiss stated that her mom is not having any slurred speech or any other symptoms that might suggest a stroke. Erika Weiss stated that her mom is not eating very well.

## 2018-11-09 NOTE — Telephone Encounter (Signed)
Seen today. 

## 2018-11-09 NOTE — Assessment & Plan Note (Signed)
Anticipate muscle strain. Supportive care of tylenol, heating pad, gentle stretching provided. Update if not improving with treatment.

## 2018-11-09 NOTE — Assessment & Plan Note (Signed)
chronic

## 2018-11-09 NOTE — Patient Instructions (Signed)
Labs today, urinalysis today. I think neck pain is coming from muscle strain. Use heating pad to the neck, use tylenol for pain, gentle stretching of the neck.  Restart metoprolol XL 25mg  once daily. Watch sodium/salt in the diet, increase water over the weekend.  If worsening pain, vomiting, one sided weakness, slurring speech, go to ER for evaluation.

## 2018-11-09 NOTE — Telephone Encounter (Signed)
Name of Medication: Lorazepam Name of Pharmacy: [facility] or Written Date and Quantity: 09/26/18, #40 Last Office Visit and Type: today, acute Next Office Visit and Type: none Last Controlled Substance Agreement Date: none Last UDS: none

## 2018-11-09 NOTE — Assessment & Plan Note (Signed)
Anticipate HTN related. Discussed tylenol use. Check labs including CBC, TSH, BMP.

## 2018-11-09 NOTE — Telephone Encounter (Signed)
Eprescribed.

## 2018-11-09 NOTE — Telephone Encounter (Signed)
Appointment scheduled with Dr. Danise Mina today at 12:30. Negative covid screening on patient and daughter.

## 2018-11-09 NOTE — Assessment & Plan Note (Signed)
Deteriorated. Continue amlodipine, restart toprol XL 25mg  daily. Update with effect.

## 2018-11-09 NOTE — Progress Notes (Signed)
This visit was conducted in person.  BP (!) 180/80 (BP Location: Left Arm, Cuff Size: Normal)   Pulse 97   Temp 98.4 F (36.9 C) (Temporal)   Ht 5' 2.75" (1.594 m)   Wt 116 lb 6 oz (52.8 kg)   SpO2 97%   BMI 20.78 kg/m    CC: elevated BP, HA and neck pain Subjective:    Patient ID: Erika Weiss, female    DOB: 1925-03-22, 85 y.o.   MRN: [id number]  HPI: Erika Weiss is a 85 y.o. female presenting on 11/09/2018 for Elevated BP (C/o elevated BP and HA.  Noticed this morning.  Pt accompanied by daughter, Tyler AasDoris. ) and Neck Pain (C/o neck pain that started yesterday. )   HTN - normally on amlodipine 10mg  daily with good effect. Metoprolol was stopped 03/2018. Had not been checking blood pressures at home. 1d h/o neck stiffness, frontal headache, urinary frequency. Denies chest pain, dyspnea, fever/chills. No dysuria. BP elevated to 200s systolic at home.   She did have burger and fries yesterday. Appetite is decreased. No increased salt in diet.      Relevant past medical, surgical, family and social history reviewed and updated as indicated. Interim medical history since our last visit reviewed. Allergies and medications reviewed and updated. Outpatient Medications Prior to Visit  Medication Sig Dispense Refill  . amLODipine (NORVASC) 10 MG tablet TAKE 1 TABLET DAILY 90 tablet 3  . cyanocobalamin 500 MCG tablet Take 500 mcg by mouth daily.    Marland Kitchen. docusate sodium (COLACE) 100 MG capsule Take 200 mg by mouth daily.    Marland Kitchen. donepezil (ARICEPT) 10 MG tablet TAKE 1 TABLET AT BEDTIME 90 tablet 1  . LORazepam (ATIVAN) 0.5 MG tablet Take 0.5-1 tablets (0.25-0.5 mg total) by mouth 2 (two) times daily as needed. for anxiety 40 tablet 0  . magnesium hydroxide (MILK OF MAGNESIA) 400 MG/5ML suspension Take by mouth as needed for mild constipation.    . Melatonin 5 MG TABS Take 2 tablets (10 mg total) by mouth at bedtime.  0  . memantine (NAMENDA) 10 MG tablet Take 1 tablet (10 mg total) by  mouth 2 (two) times daily. 180 tablet 0  . mirtazapine (REMERON) 15 MG tablet Take 1 tablet (15 mg total) by mouth at bedtime. 90 tablet 0  . naproxen sodium (ANAPROX) 220 MG tablet Take 220 mg by mouth as needed (pain).     Marland Kitchen. OLANZapine (ZYPREXA) 5 MG tablet Take 1 tablet (5 mg total) by mouth at bedtime. 90 tablet 0  . sertraline (ZOLOFT) 100 MG tablet TAKE 1 TABLET DAILY 90 tablet 1   No facility-administered medications prior to visit.      Per HPI unless specifically indicated in ROS section below Review of Systems Objective:    BP (!) 180/80 (BP Location: Left Arm, Cuff Size: Normal)   Pulse 97   Temp 98.4 F (36.9 C) (Temporal)   Ht 5' 2.75" (1.594 m)   Wt 116 lb 6 oz (52.8 kg)   SpO2 97%   BMI 20.78 kg/m   Wt Readings from Last 3 Encounters:  11/09/18 116 lb 6 oz (52.8 kg)  04/24/18 119 lb 8 oz (54.2 kg)  03/27/18 119 lb 8 oz (54.2 kg)    Physical Exam Vitals signs and nursing note reviewed.  Constitutional:      Appearance: Normal appearance. She is not ill-appearing.  HENT:     Mouth/Throat:     Mouth: Mucous membranes are  moist.     Pharynx: No posterior oropharyngeal erythema.  Eyes:     Extraocular Movements: Extraocular movements intact.     Pupils: Pupils are equal, round, and reactive to light.  Neck:     Musculoskeletal: Neck supple. Muscular tenderness (at insertion into L occiput) present. No neck rigidity.     Vascular: No carotid bruit.     Comments: Limited ROM due to neck pain but neck supple and soft without rigidity. No midline cervical neck pain or significant paracervical mm pain.  Cardiovascular:     Rate and Rhythm: Normal rate and regular rhythm.     Pulses: Normal pulses.     Heart sounds: Murmur (4/6 systolic) present.  Pulmonary:     Effort: Pulmonary effort is normal. No respiratory distress.     Breath sounds: Normal breath sounds. No wheezing, rhonchi or rales.  Musculoskeletal:     Right lower leg: No edema.     Left lower leg:  No edema.  Lymphadenopathy:     Cervical: No cervical adenopathy.  Skin:    Capillary Refill: Capillary refill takes less than 2 seconds.  Neurological:     General: No focal deficit present.     Mental Status: She is alert.     Sensory: Sensation is intact.     Motor: Motor function is intact.     Gait: Gait abnormal (slowed).     Comments:  CN grossly intact No carotid bruit 5/5 strength BUE Grip strength intact Chronically slowed movements  Psychiatric:        Mood and Affect: Mood normal.        Behavior: Behavior normal.       Results for orders placed or performed in visit on 11/09/18  POCT Urinalysis Dipstick (Automated)  Result Value Ref Range   Color, UA yellow    Clarity, UA clear    Glucose, UA Negative Negative   Bilirubin, UA negative    Ketones, UA negative    Spec Grav, UA 1.015 1.010 - 1.025   Blood, UA negative    pH, UA 6.5 5.0 - 8.0   Protein, UA Negative Negative   Urobilinogen, UA 0.2 0.2 or 1.0 E.U./dL   Nitrite, UA negative    Leukocytes, UA Negative Negative    Assessment & Plan:   Problem List Items Addressed This Visit    Systolic murmur    chronic      Occipital pain    Anticipate muscle strain. Supportive care of tylenol, heating pad, gentle stretching provided. Update if not improving with treatment.       Relevant Medications   metoprolol succinate (TOPROL-XL) 25 MG 24 hr tablet   Hypertension - Primary    Deteriorated. Continue amlodipine, restart toprol XL 25mg  daily. Update with effect.      Relevant Medications   metoprolol succinate (TOPROL-XL) 25 MG 24 hr tablet   Other Relevant Orders   TSH   Basic metabolic panel   Acute nonintractable headache    Anticipate HTN related. Discussed tylenol use. Check labs including CBC, TSH, BMP.       Relevant Medications   metoprolol succinate (TOPROL-XL) 25 MG 24 hr tablet   Other Relevant Orders   TSH   CBC with Differential/Platelet   Basic metabolic panel    Other Visit  Diagnoses    Urinary frequency       Relevant Orders   POCT Urinalysis Dipstick (Automated) (Completed)       Meds ordered this  encounter  Medications  . metoprolol succinate (TOPROL-XL) 25 MG 24 hr tablet    Sig: Take 1 tablet (25 mg total) by mouth daily.    Dispense:  30 tablet    Refill:  6   Orders Placed This Encounter  Procedures  . TSH  . CBC with Differential/Platelet  . Basic metabolic panel  . POCT Urinalysis Dipstick (Automated)    Patient Instructions  Labs today, urinalysis today. I think neck pain is coming from muscle strain. Use heating pad to the neck, use tylenol for pain, gentle stretching of the neck.  Restart metoprolol XL 25mg  once daily. Watch sodium/salt in the diet, increase water over the weekend.  If worsening pain, vomiting, one sided weakness, slurring speech, go to ER for evaluation.     Follow up plan: Return if symptoms worsen or fail to improve.  Ria Bush, MD

## 2018-11-12 ENCOUNTER — Telehealth: Payer: Self-pay | Admitting: Student

## 2018-11-12 NOTE — Telephone Encounter (Signed)
Palliative NP received call from patient's daughter [name]. She is requesting for a follow up visit. Visit scheduled for 11/14/2018 at 11am.

## 2018-11-14 ENCOUNTER — Other Ambulatory Visit: Payer: Medicare Other | Admitting: Student

## 2018-11-14 ENCOUNTER — Other Ambulatory Visit: Payer: Self-pay

## 2018-11-14 DIAGNOSIS — Z515 Encounter for palliative care: Secondary | ICD-10-CM

## 2018-11-14 NOTE — Progress Notes (Signed)
[facility] Consult Note Telephone: [phone]  Fax: [phone]  PATIENT NAME: Erika Weiss DOB: [birth year] MRN: [id number]  PRIMARY CARE PROVIDER:   Ria Bush, MD  REFERRING PROVIDER:  Ria Bush, MD [place],  [place] [id number]  RESPONSIBLE PARTY: Daughters Verdie Drown [phone], Diesha Rostad [phone]     ASSESSMENT:  Erika Weiss is sitting in her living room upon arrival. She has pleasant affect; no acute distress noted. Daughter [name] is present. Patient talks about working at [employer], also spoke of her upcoming birthday. We discussed symptom management. We discussed code status; patient does have a Living Will per [name]. She is a Full Code. Introduced MOST form. [name] would like to review and NP will fill out for patient. Palliative Care will continue to provide ongoing support and make recommendations as needed.      RECOMMENDATIONS and PLAN:  1. Code status: Full Code. 2. Medical goals of therapy/symptom management: Anxiety/agitation: continue zyprexa 5mg  each evening; continue lorazepam 0.5mg  BID prn.  Sleep-continue melatonin 10mg  QHS. Mild pedal edema: she is encouraged to elevate her feet while sitting or resting in bed. 3. Discharge Planning: Erika Weiss will continue to reside at home with husband and continued family support.   Palliative Medicine will follow up in 8 weeks or sooner, if needed.    I spent 30 minutes providing this consultation,  from 11:00am to 11:30am. More than 50% of the time in this consultation was spent coordinating communication.   HISTORY OF PRESENT ILLNESS:  Erika Weiss is a 85 y.o.  female with multiple medical problems including dementia with behavioral disturbances, hypertension, hypercholesterolemia, hyperglycemia, osteoarthritis, systolic murmur, weight loss, hx of colon cancer. Palliative Care was asked to help address goals of care; she is  seen for follow up visit today. Erika Weiss reports doing well. [name] reports patient had been sleeping more during the day, but is doing better since her old blood pressure medication has been added back in. She denies pain, headache, dizziness, feeling light headed, shortness of breath, nausea. She reports sleeping well at night. She states her appetite has been okay; this is also endorsed by [name]. She is also drinking one nutritional supplement a day. Family members alternate staying in the home with patient and her husband. They assist with medications and meals. [name] states patient has been doing better in the evenings. She continues to read newspaper, magazines, which she enjoys. No falls reported. No recent infections. No ER visits or hospitalizations.   CODE STATUS: Full Code  PPS: 50% HOSPICE ELIGIBILITY/DIAGNOSIS: TBD  PAST MEDICAL HISTORY:  Past Medical History:  Diagnosis Date  . Finger wound, simple, open, subsequent encounter 01/24/2017  . Heart murmur new 2017?  Marland Kitchen History of colon cancer   . HLD (hyperlipidemia)   . Hyperglycemia   . Hypertension   . Osteoarthritis    knees  . Varicose veins of lower extremities with other complications     SOCIAL HX:  Social History   Tobacco Use  . Smoking status: Former Smoker    Quit date: 04/25/1978    Years since quitting: 40.5  . Smokeless tobacco: Never Used  Substance Use Topics  . Alcohol use: No    Alcohol/week: 0.0 standard drinks    ALLERGIES: No Known Allergies   PERTINENT MEDICATIONS:  Outpatient Encounter Medications as of 11/14/2018  Medication Sig  . amLODipine (NORVASC) 10 MG tablet TAKE 1 TABLET DAILY  . cyanocobalamin 500  MCG tablet Take 500 mcg by mouth daily.  Marland Kitchen. docusate sodium (COLACE) 100 MG capsule Take 200 mg by mouth daily.  Marland Kitchen. donepezil (ARICEPT) 10 MG tablet TAKE 1 TABLET AT BEDTIME  . LORazepam (ATIVAN) 0.5 MG tablet TAKE 1/2 TO 1 TABLET BY MOUTH TWICE DAILY AS NEEDED FOR ANXIETY  . magnesium  hydroxide (MILK OF MAGNESIA) 400 MG/5ML suspension Take by mouth as needed for mild constipation.  . Melatonin 5 MG TABS Take 2 tablets (10 mg total) by mouth at bedtime.  . memantine (NAMENDA) 10 MG tablet Take 1 tablet (10 mg total) by mouth 2 (two) times daily.  . metoprolol succinate (TOPROL-XL) 25 MG 24 hr tablet Take 1 tablet (25 mg total) by mouth daily.  . mirtazapine (REMERON) 15 MG tablet Take 1 tablet (15 mg total) by mouth at bedtime.  . naproxen sodium (ANAPROX) 220 MG tablet Take 220 mg by mouth as needed (pain).   Marland Kitchen. OLANZapine (ZYPREXA) 5 MG tablet Take 1 tablet (5 mg total) by mouth at bedtime.  . sertraline (ZOLOFT) 100 MG tablet TAKE 1 TABLET DAILY   No facility-administered encounter medications on file as of 11/14/2018.     PHYSICAL EXAM:   General: NAD, frail appearing, thin Cardiovascular: regular rate and rhythm Pulmonary: clear ant fields Abdomen: soft, nontender, + bowel sounds GU: no suprapubic tenderness Extremities: mild pedal edema, no joint deformities Skin: no rashes Neurological: Weakness but otherwise nonfocal  Luella CookLaToya S Orli Degrave, NP

## 2018-11-19 ENCOUNTER — Telehealth: Payer: Self-pay | Admitting: Family Medicine

## 2018-11-19 MED ORDER — METOPROLOL SUCCINATE ER 25 MG PO TB24: 25.0000 mg | ORAL_TABLET | Freq: Every day | ORAL | Status: DC

## 2018-11-19 NOTE — Telephone Encounter (Signed)
Patient's Daughter Edd Fabian is requesting a prescription besent in to the patient's mail order pharmacy.   Metoprolol    [facility]

## 2018-11-19 NOTE — Telephone Encounter (Signed)
E-scribed refill to [facility].  

## 2018-11-29 ENCOUNTER — Telehealth: Payer: Self-pay | Admitting: Family Medicine

## 2018-11-29 DIAGNOSIS — R2689 Other abnormalities of gait and mobility: Secondary | ICD-10-CM

## 2018-11-29 NOTE — Telephone Encounter (Signed)
Ok to do. Referral placed.

## 2018-11-29 NOTE — Telephone Encounter (Signed)
Spoke with pt's daughter, [name], asking for clarification of PT referral.   Says pt's gait/balance seems to be a little off.  Says pt had PT referral in past from Dr. Darnell Level for same thing and it helped.

## 2018-11-29 NOTE — Telephone Encounter (Signed)
Erika Weiss calling requesting a referral for [name] for Physical Therapy  [name] did not leave a call back number on the VM but she is on the patients DPR  Please advise.

## 2018-12-03 ENCOUNTER — Telehealth: Payer: Self-pay | Admitting: Family Medicine

## 2018-12-03 DIAGNOSIS — F0391 Unspecified dementia with behavioral disturbance: Secondary | ICD-10-CM

## 2018-12-03 DIAGNOSIS — R2689 Other abnormalities of gait and mobility: Secondary | ICD-10-CM

## 2018-12-03 DIAGNOSIS — W19XXXS Unspecified fall, sequela: Secondary | ICD-10-CM

## 2018-12-03 NOTE — Addendum Note (Signed)
Addended by: Ria Bush on: 12/03/2018 05:29 PM   Modules accepted: Orders

## 2018-12-03 NOTE — Telephone Encounter (Signed)
New referral placed.  Cancelled outpt PT referral.

## 2018-12-03 NOTE — Telephone Encounter (Signed)
Called Daughter [name], the request she was wanting is a Home Health Physical therapy Referral not Outpatient. Please place the [facility] PT order in Epic

## 2018-12-03 NOTE — Telephone Encounter (Signed)
Daughters phone number is [phone], call [name] back when the [facility] PT order gets put in.

## 2018-12-03 NOTE — Telephone Encounter (Signed)
Referral cancelled. See other note.

## 2018-12-04 NOTE — Telephone Encounter (Signed)
Left message on vm for Debra per dpr relaying Dr. Synthia Innocent message.

## 2018-12-05 ENCOUNTER — Telehealth: Payer: Self-pay | Admitting: Family Medicine

## 2018-12-05 NOTE — Telephone Encounter (Signed)
Noted  

## 2018-12-05 NOTE — Telephone Encounter (Signed)
Discussed with daughter the Face to Face necessary for Medicare to cover [facility] PT and she says it is too much going on with her mother right now so she now wants to cancel the Referral. I went over the guidelines with the daughter so if they change their minds they will schedule an appt with Dr Darnell Level to discuss the [facility] PT order directly. Cancelling the Referral at this time.

## 2018-12-07 ENCOUNTER — Telehealth: Payer: Self-pay

## 2018-12-07 NOTE — Telephone Encounter (Signed)
Spoke with pt's daughter, [name], relaying Dr. Synthia Innocent message.  Erika Weiss verbalizes understanding and expresses her thanks.

## 2018-12-07 NOTE — Telephone Encounter (Signed)
Watch symptoms for now, let us know if worsening more productive cough or associated with fever.  Could try small amt of OTC delsym or dimetapp for cough, alternatively could try Rx tessalon perls to swallow not chew for cough let us know if wants an Rx for this.  Continue tea and honey with lemon for hoarseness.

## 2018-12-07 NOTE — Telephone Encounter (Signed)
[name] pts daughter (DPR signed) left v/m; pt is hoarse for 2 days and slight non prod cough on and off especially when eating. Eyes appear slightly swollen. Pt feels fine; no S/T; pt has been drinking tea, honey and lemon. No fever,chills,SOB,muscle pain,diarrhea,H/A or loss of taste or smell; no travel and no know exposure to + covid. Pt is eating good. Pt does have dementia and [name] is not sure if pt is relaying all info to her;  pt fell 1 wk ago and has bruise on rt upper back near where underarm is; some tenderness, no real pain.warm baths and cold packs on area. [name] thinks pt is OK from fall. Debra request cb with any suggestions for hoarseness and cough or does pt need appt.

## 2018-12-13 ENCOUNTER — Other Ambulatory Visit: Payer: Self-pay | Admitting: Family Medicine

## 2018-12-14 NOTE — Telephone Encounter (Signed)
Mirtazapine last filled: 09/26/18, #90 Olanzapine last filled:  09/26/18, #90 Last OV:  7/17/2, f/u Next OV:  none

## 2018-12-25 ENCOUNTER — Other Ambulatory Visit: Payer: Self-pay | Admitting: Family Medicine

## 2018-12-25 NOTE — Telephone Encounter (Signed)
Eprescribed.

## 2019-01-01 ENCOUNTER — Other Ambulatory Visit: Payer: Self-pay | Admitting: Family Medicine

## 2019-01-01 NOTE — Telephone Encounter (Signed)
Namenda Last filled 09/26/18, #180 Last OV:  11/09/18, f/u Next OV:  none

## 2019-01-09 ENCOUNTER — Telehealth: Payer: Self-pay | Admitting: Nurse Practitioner

## 2019-01-09 NOTE — Telephone Encounter (Signed)
Spoke with Willene Hatchet (daughter) and have scheduled an In-Person Palliative f/u visit for 01/29/19 @ 4 PM

## 2019-01-11 ENCOUNTER — Telehealth: Payer: Self-pay | Admitting: Nurse Practitioner

## 2019-01-11 NOTE — Telephone Encounter (Signed)
Rec'd call from patient's daughter Willene Hatchet and she wanted to cancel all Palliative services at this time.

## 2019-01-29 ENCOUNTER — Other Ambulatory Visit: Payer: Medicare Other | Admitting: Nurse Practitioner

## 2019-01-30 ENCOUNTER — Other Ambulatory Visit: Payer: Self-pay | Admitting: Family Medicine

## 2019-01-30 NOTE — Telephone Encounter (Signed)
Name of Medication: Lorazepam Name of Pharmacy: [facility] or Written Date and Quantity: 12/25/18, #40 Last Office Visit and Type: 11/09/18, HTN f/u Next Office Visit and Type: none Last Controlled Substance Agreement Date: none Last UDS: none

## 2019-01-31 ENCOUNTER — Other Ambulatory Visit: Payer: Self-pay

## 2019-01-31 ENCOUNTER — Ambulatory Visit (INDEPENDENT_AMBULATORY_CARE_PROVIDER_SITE_OTHER): Payer: Medicare Other

## 2019-01-31 ENCOUNTER — Telehealth (INDEPENDENT_AMBULATORY_CARE_PROVIDER_SITE_OTHER): Payer: Medicare Other | Admitting: Family Medicine

## 2019-01-31 DIAGNOSIS — R350 Frequency of micturition: Secondary | ICD-10-CM | POA: Diagnosis not present

## 2019-01-31 DIAGNOSIS — Z23 Encounter for immunization: Secondary | ICD-10-CM

## 2019-01-31 NOTE — Telephone Encounter (Signed)
Spoke with pt's daughter, Tamela Oddi (on dpr), relaying Dr. Synthia Innocent message.  Verbalizes understanding and denies pt has any mental status changes, fever and has not c/o abd/back pain.  Says they can drop off a urine sample tomorrow but declines virtual visit at this time.   [UA ordered]

## 2019-01-31 NOTE — Telephone Encounter (Signed)
Patient's Daughter Tamela Oddi called today concerned about the patient. She stated she is urinating a lot.  She stated that she woke up in the middle of the night to change her pad because it was soaking wet. Then today at [place] she had to change her again and the pad was soaking wet again. [name] stated it was not long after that she was changing her pad out again because the patient was soaking wet from urinating so much  She would like to know if this is something they should be concerned about or if this sound normal. She would like to know what you suggest.   Tamela Oddi is requesting a call back

## 2019-01-31 NOTE — Telephone Encounter (Signed)
plz have them collect urine sample for UA and micro if abnormal, for urinary frequency. Any mental status changes, fevers, abd or back pain?  Would offer virtual visit for tomorrow. Could place at 10am (30 min)

## 2019-01-31 NOTE — Telephone Encounter (Signed)
ERx 

## 2019-02-01 ENCOUNTER — Other Ambulatory Visit: Payer: Self-pay

## 2019-02-01 ENCOUNTER — Other Ambulatory Visit: Payer: Medicare Other

## 2019-02-01 LAB — POC URINALSYSI DIPSTICK (AUTOMATED)
Blood, UA: NEGATIVE
Glucose, UA: NEGATIVE
Ketones, UA: NEGATIVE
Leukocytes, UA: NEGATIVE
Nitrite, UA: NEGATIVE
Protein, UA: POSITIVE — AB
Spec Grav, UA: 1.02 (ref 1.010–1.025)
Urobilinogen, UA: 0.2 E.U./dL
pH, UA: 6 (ref 5.0–8.0)

## 2019-02-01 NOTE — Addendum Note (Signed)
Addended by: Lurlean Nanny on: 02/01/2019 12:45 PM   Modules accepted: Orders

## 2019-02-08 ENCOUNTER — Other Ambulatory Visit: Payer: Self-pay | Admitting: Family Medicine

## 2019-02-08 NOTE — Telephone Encounter (Signed)
Donepezil Last filled:  11/12/18, #90 Last OV:  11/09/18, f/u Next OV:  none

## 2019-02-25 ENCOUNTER — Telehealth: Payer: Self-pay | Admitting: *Deleted

## 2019-02-25 NOTE — Telephone Encounter (Signed)
Erika Weiss patient's daughter called stating that her mom is not sleeping well at night. Erika Weiss stated that she is only sleeping about 2 hours at night and will not go back to sleep. Erika Weiss stated that she gets up moving around and they are concerned because she is a fall risk. Patient is taking all of the medications that have been prescribed for her. Erika Weiss wants to know if she can be given something to help her sleep more hours at night? [facility]

## 2019-02-26 NOTE — Telephone Encounter (Signed)
plz review meds with daughter - is she still taking remeron 15mg  and olanzapine 5mg  nightly, and melatonin? How is she taking lorazepam?  We may change remeron dose to 7.5mg  which is more sedating than higher dose - are they able to cut 15mg  tablet in half?

## 2019-02-27 ENCOUNTER — Other Ambulatory Visit: Payer: Self-pay | Admitting: Family Medicine

## 2019-02-27 NOTE — Telephone Encounter (Signed)
Patient's daughter called and said patient took her last pill today.  They haven't received the medication from mail order,yet.  Please call in rx to [facility].

## 2019-02-27 NOTE — Telephone Encounter (Signed)
Spoke with pt's daughter, [name] (on dpr), relaying Dr. Synthia Innocent message.  [name] confirms pt is taking Remeron, olanzapine and melatonin nightly.  Takes lorazepam 1 tab BID.  Says they are willing to try half tablet of Remeron (7.5 mg total).  Asking if they can start that tonight.  Pls advise.

## 2019-02-28 NOTE — Addendum Note (Signed)
Addended by: Ria Bush on: 02/28/2019 10:32 AM   Modules accepted: Orders

## 2019-02-28 NOTE — Telephone Encounter (Signed)
Erika Weiss (DPR signed) said she could not find a bottle with remeron on it does it have another name. I advised that mirtazapine 15 mg is same as remeron 15 mg. Erika Weiss said she had a bottle with mirtazapine on it and that was all she needed. Erika Weiss will cb if needed.

## 2019-02-28 NOTE — Telephone Encounter (Signed)
Called [facility] about donepezil refill sent on 02/11/19, #90/1.  Told med is currently with [facility] out for delivery but also shows a 'delay in transit' message.  Suggests a short term rx be sent to pt's local pharmacy.   E-scribed 30-day refill to [facility].  Fyi to Dr. Darnell Level.

## 2019-02-28 NOTE — Telephone Encounter (Signed)
Yes - ok to go ahead and start

## 2019-02-28 NOTE — Telephone Encounter (Signed)
Erika Weiss advised of everything 

## 2019-03-01 NOTE — Telephone Encounter (Signed)
Noted thanks °

## 2019-03-13 ENCOUNTER — Other Ambulatory Visit: Payer: Self-pay | Admitting: Family Medicine

## 2019-03-13 NOTE — Telephone Encounter (Signed)
ERx 

## 2019-03-13 NOTE — Telephone Encounter (Signed)
Name of Medication: Lorazepam Name of Pharmacy: [facility] or Written Date and Quantity: 02/01/19, #40 Last Office Visit and Type: 11/09/18, HTN f/u acute HA Next Office Visit and Type: NONE Last Controlled Substance Agreement Date: none Last UDS: none

## 2019-04-04 ENCOUNTER — Telehealth: Payer: Self-pay | Admitting: Family Medicine

## 2019-04-04 MED ORDER — OLANZAPINE 5 MG PO TABS: 5.0000 mg | ORAL_TABLET | Freq: Every day | ORAL | 0 refills | Status: DC

## 2019-04-04 MED ORDER — METOPROLOL SUCCINATE ER 25 MG PO TB24: 25.0000 mg | ORAL_TABLET | Freq: Every day | ORAL | 0 refills | Status: DC

## 2019-04-04 MED ORDER — MIRTAZAPINE 15 MG PO TABS: 7.5000 mg | ORAL_TABLET | Freq: Every day | ORAL | 0 refills | Status: DC

## 2019-04-04 NOTE — Telephone Encounter (Signed)
meds sent to local pharmacy

## 2019-04-04 NOTE — Telephone Encounter (Signed)
Edd Fabian (daughter) called needing to get refill mail order is taking too long  Olanzapine  5mg  Mirtazapine 15mg  Metoprolol 25mg   1 month supply  [place]  Pt has 2 pills

## 2019-04-04 NOTE — Telephone Encounter (Signed)
Olanzapine last rx:  12/14/18, #90 Mirtazapine last rx:  02/28/19, #90 Last OV:  11/09/18, HTN Next OV:  none

## 2019-04-05 NOTE — Telephone Encounter (Signed)
Left message on vm per dpr notifying pt refills sent to [facility].

## 2019-04-11 ENCOUNTER — Encounter: Payer: Self-pay | Admitting: Family Medicine

## 2019-04-11 DIAGNOSIS — Z515 Encounter for palliative care: Secondary | ICD-10-CM | POA: Insufficient documentation

## 2019-04-15 ENCOUNTER — Other Ambulatory Visit: Payer: Self-pay | Admitting: Family Medicine

## 2019-04-15 NOTE — Telephone Encounter (Signed)
E-scribed refill.  Pls schedule annual wellness and lab visits.

## 2019-04-24 ENCOUNTER — Other Ambulatory Visit: Payer: Self-pay | Admitting: Family Medicine

## 2019-04-24 NOTE — Telephone Encounter (Signed)
Last filled 03-14-19 #40 Last OV 11-09-18 Next OV 05-23-19 [place]

## 2019-04-29 NOTE — Telephone Encounter (Addendum)
plz notify ERx 

## 2019-04-29 NOTE — Telephone Encounter (Signed)
Ms. Dan Humphreys advised

## 2019-04-29 NOTE — Telephone Encounter (Signed)
Erika Weiss called checking on rx  Pt is completely out of meds  Best number [phone]  Please called when sent to pharmacy

## 2019-05-01 ENCOUNTER — Other Ambulatory Visit: Payer: Self-pay | Admitting: Family Medicine

## 2019-05-01 MED ORDER — AMLODIPINE BESYLATE 10 MG PO TABS: 10.0000 mg | ORAL_TABLET | Freq: Every day | ORAL | 0 refills | Status: DC

## 2019-05-01 NOTE — Telephone Encounter (Signed)
[name] advised, RX sent in for 10 days.

## 2019-05-01 NOTE — Telephone Encounter (Signed)
[name] (daughter) called stating pt needs 10 day supply of  Amlodipine  [place] [facility]  Pt has 3 pills left

## 2019-05-10 ENCOUNTER — Other Ambulatory Visit: Payer: Self-pay

## 2019-05-10 MED ORDER — SERTRALINE HCL 100 MG PO TABS: 100.0000 mg | ORAL_TABLET | Freq: Every day | ORAL | 0 refills | Status: DC

## 2019-05-10 NOTE — Telephone Encounter (Signed)
E-scribed refill 

## 2019-05-14 ENCOUNTER — Other Ambulatory Visit: Payer: Self-pay | Admitting: Family Medicine

## 2019-05-14 DIAGNOSIS — E785 Hyperlipidemia, unspecified: Secondary | ICD-10-CM

## 2019-05-14 DIAGNOSIS — R7303 Prediabetes: Secondary | ICD-10-CM

## 2019-05-14 DIAGNOSIS — I10 Essential (primary) hypertension: Secondary | ICD-10-CM

## 2019-05-14 DIAGNOSIS — F0391 Unspecified dementia with behavioral disturbance: Secondary | ICD-10-CM

## 2019-05-16 ENCOUNTER — Other Ambulatory Visit (INDEPENDENT_AMBULATORY_CARE_PROVIDER_SITE_OTHER): Payer: Medicare Other

## 2019-05-16 ENCOUNTER — Telehealth: Payer: Self-pay | Admitting: Family Medicine

## 2019-05-16 ENCOUNTER — Ambulatory Visit (INDEPENDENT_AMBULATORY_CARE_PROVIDER_SITE_OTHER): Payer: Medicare Other

## 2019-05-16 DIAGNOSIS — R7303 Prediabetes: Secondary | ICD-10-CM | POA: Diagnosis not present

## 2019-05-16 DIAGNOSIS — Z0000 Encounter for general adult medical examination without abnormal findings: Secondary | ICD-10-CM | POA: Diagnosis not present

## 2019-05-16 DIAGNOSIS — I10 Essential (primary) hypertension: Secondary | ICD-10-CM

## 2019-05-16 DIAGNOSIS — E785 Hyperlipidemia, unspecified: Secondary | ICD-10-CM

## 2019-05-16 DIAGNOSIS — F0391 Unspecified dementia with behavioral disturbance: Secondary | ICD-10-CM

## 2019-05-16 LAB — COMPREHENSIVE METABOLIC PANEL
ALT: 8 U/L (ref 0–35)
AST: 19 U/L (ref 0–37)
Albumin: 4 g/dL (ref 3.5–5.2)
Alkaline Phosphatase: 96 U/L (ref 39–117)
BUN: 27 mg/dL — ABNORMAL HIGH (ref 6–23)
CO2: 25 mEq/L (ref 19–32)
Calcium: 9.5 mg/dL (ref 8.4–10.5)
Chloride: 109 mEq/L (ref 96–112)
Creatinine, Ser: 1.2 mg/dL (ref 0.40–1.20)
GFR: 50.79 mL/min — ABNORMAL LOW (ref >60.00)
Glucose, Bld: 159 mg/dL — ABNORMAL HIGH (ref 70–99)
Potassium: 4.2 mEq/L (ref 3.5–5.1)
Sodium: 141 mEq/L (ref 135–145)
Total Bilirubin: 0.3 mg/dL (ref 0.2–1.2)
Total Protein: 7.2 g/dL (ref 6.0–8.3)

## 2019-05-16 LAB — LIPID PANEL
Cholesterol: 242 mg/dL — ABNORMAL HIGH (ref 0–200)
HDL: 66.1 mg/dL (ref >39.00)
LDL Cholesterol: 152 mg/dL — ABNORMAL HIGH (ref 0–99)
NonHDL: 175.76
Total CHOL/HDL Ratio: 4
Triglycerides: 117 mg/dL (ref 0.0–149.0)
VLDL: 23.4 mg/dL (ref 0.0–40.0)

## 2019-05-16 LAB — MICROALBUMIN / CREATININE URINE RATIO
Creatinine,U: 137.7 mg/dL
Microalb Creat Ratio: 7.8 mg/g (ref 0.0–30.0)
Microalb, Ur: 10.8 mg/dL — ABNORMAL HIGH (ref 0.0–1.9)

## 2019-05-16 LAB — HEMOGLOBIN A1C: Hgb A1c MFr Bld: 5.9 % (ref 4.6–6.5)

## 2019-05-16 MED ORDER — SERTRALINE HCL 100 MG PO TABS: 100.0000 mg | ORAL_TABLET | Freq: Every day | ORAL | 0 refills | Status: DC

## 2019-05-16 MED ORDER — AMLODIPINE BESYLATE 10 MG PO TABS: 10.0000 mg | ORAL_TABLET | Freq: Every day | ORAL | 0 refills | Status: DC

## 2019-05-16 NOTE — Patient Instructions (Signed)
[name] , Thank you for taking time to come for your Medicare Wellness Visit. I appreciate your ongoing commitment to your health goals. Please review the following plan we discussed and let me know if I can assist you in the future.   Screening recommendations/referrals: Colonoscopy: no longer required Mammogram: no longer required Bone Density: will schedule Recommended yearly ophthalmology/optometry visit for glaucoma screening and checkup Recommended yearly dental visit for hygiene and checkup  Vaccinations: Influenza vaccine: Up to date, completed 01/31/2019 Pneumococcal vaccine: will get at physical Tdap vaccine: Up to date, completed 01/20/2017 Shingles vaccine: discussed    Advanced directives: copy in chart  Conditions/risks identified: hypertension, hyperlipidemia  Next appointment: 05/23/2019 @ 12 pm   Preventive Care 65 Years and Older, Female Preventive care refers to lifestyle choices and visits with your health care provider that can promote health and wellness. What does preventive care include?  A yearly physical exam. This is also called an annual well check.  Dental exams once or twice a year.  Routine eye exams. Ask your health care provider how often you should have your eyes checked.  Personal lifestyle choices, including:  Daily care of your teeth and gums.  Regular physical activity.  Eating a healthy diet.  Avoiding tobacco and drug use.  Limiting alcohol use.  Practicing safe sex.  Taking low-dose aspirin every day.  Taking vitamin and mineral supplements as recommended by your health care provider. What happens during an annual well check? The services and screenings done by your health care provider during your annual well check will depend on your age, overall health, lifestyle risk factors, and family history of disease. Counseling  Your health care provider may ask you questions about your:  Alcohol use.  Tobacco use.  Drug  use.  Emotional well-being.  Home and relationship well-being.  Sexual activity.  Eating habits.  History of falls.  Memory and ability to understand (cognition).  Work and work Astronomer.  Reproductive health. Screening  You may have the following tests or measurements:  Height, weight, and BMI.  Blood pressure.  Lipid and cholesterol levels. These may be checked every 5 years, or more frequently if you are over 26 years old.  Skin check.  Lung cancer screening. You may have this screening every year starting at age 21 if you have a 30-pack-year history of smoking and currently smoke or have quit within the past 15 years.  Fecal occult blood test (FOBT) of the stool. You may have this test every year starting at age 86.  Flexible sigmoidoscopy or colonoscopy. You may have a sigmoidoscopy every 5 years or a colonoscopy every 10 years starting at age 56.  Hepatitis C blood test.  Hepatitis B blood test.  Sexually transmitted disease (STD) testing.  Diabetes screening. This is done by checking your blood sugar (glucose) after you have not eaten for a while (fasting). You may have this done every 1-3 years.  Bone density scan. This is done to screen for osteoporosis. You may have this done starting at age 62.  Mammogram. This may be done every 1-2 years. Talk to your health care provider about how often you should have regular mammograms. Talk with your health care provider about your test results, treatment options, and if necessary, the need for more tests. Vaccines  Your health care provider may recommend certain vaccines, such as:  Influenza vaccine. This is recommended every year.  Tetanus, diphtheria, and acellular pertussis (Tdap, Td) vaccine. You may need a  Td booster every 10 years.  Zoster vaccine. You may need this after age 35.  Pneumococcal 13-valent conjugate (PCV13) vaccine. One dose is recommended after age 49.  Pneumococcal polysaccharide  (PPSV23) vaccine. One dose is recommended after age 66. Talk to your health care provider about which screenings and vaccines you need and how often you need them. This information is not intended to replace advice given to you by your health care provider. Make sure you discuss any questions you have with your health care provider. Document Released: 05/08/2015 Document Revised: 12/30/2015 Document Reviewed: 02/10/2015 Elsevier Interactive Patient Education  2017 [place] Prevention in the Home Falls can cause injuries. They can happen to people of all ages. There are many things you can do to make your home safe and to help prevent falls. What can I do on the outside of my home?  Regularly fix the edges of walkways and driveways and fix any cracks.  Remove anything that might make you trip as you walk through a door, such as a raised step or threshold.  Trim any bushes or trees on the path to your home.  Use bright outdoor lighting.  Clear any walking paths of anything that might make someone trip, such as rocks or tools.  Regularly check to see if handrails are loose or broken. Make sure that both sides of any steps have handrails.  Any raised decks and porches should have guardrails on the edges.  Have any leaves, snow, or ice cleared regularly.  Use sand or salt on walking paths during winter.  Clean up any spills in your garage right away. This includes oil or grease spills. What can I do in the bathroom?  Use night lights.  Install grab bars by the toilet and in the tub and shower. Do not use towel bars as grab bars.  Use non-skid mats or decals in the tub or shower.  If you need to sit down in the shower, use a plastic, non-slip stool.  Keep the floor dry. Clean up any water that spills on the floor as soon as it happens.  Remove soap buildup in the tub or shower regularly.  Attach bath mats securely with double-sided non-slip rug tape.  Do not have  throw rugs and other things on the floor that can make you trip. What can I do in the bedroom?  Use night lights.  Make sure that you have a light by your bed that is easy to reach.  Do not use any sheets or blankets that are too big for your bed. They should not hang down onto the floor.  Have a firm chair that has side arms. You can use this for support while you get dressed.  Do not have throw rugs and other things on the floor that can make you trip. What can I do in the kitchen?  Clean up any spills right away.  Avoid walking on wet floors.  Keep items that you use a lot in easy-to-reach places.  If you need to reach something above you, use a strong step stool that has a grab bar.  Keep electrical cords out of the way.  Do not use floor polish or wax that makes floors slippery. If you must use wax, use non-skid floor wax.  Do not have throw rugs and other things on the floor that can make you trip. What can I do with my stairs?  Do not leave any items on the stairs.  Make sure that there are handrails on both sides of the stairs and use them. Fix handrails that are broken or loose. Make sure that handrails are as long as the stairways.  Check any carpeting to make sure that it is firmly attached to the stairs. Fix any carpet that is loose or worn.  Avoid having throw rugs at the top or bottom of the stairs. If you do have throw rugs, attach them to the floor with carpet tape.  Make sure that you have a light switch at the top of the stairs and the bottom of the stairs. If you do not have them, ask someone to add them for you. What else can I do to help prevent falls?  Wear shoes that:  Do not have high heels.  Have rubber bottoms.  Are comfortable and fit you well.  Are closed at the toe. Do not wear sandals.  If you use a stepladder:  Make sure that it is fully opened. Do not climb a closed stepladder.  Make sure that both sides of the stepladder are  locked into place.  Ask someone to hold it for you, if possible.  Clearly mark and make sure that you can see:  Any grab bars or handrails.  First and last steps.  Where the edge of each step is.  Use tools that help you move around (mobility aids) if they are needed. These include:  Canes.  Walkers.  Scooters.  Crutches.  Turn on the lights when you go into a dark area. Replace any light bulbs as soon as they burn out.  Set up your furniture so you have a clear path. Avoid moving your furniture around.  If any of your floors are uneven, fix them.  If there are any pets around you, be aware of where they are.  Review your medicines with your doctor. Some medicines can make you feel dizzy. This can increase your chance of falling. Ask your doctor what other things that you can do to help prevent falls. This information is not intended to replace advice given to you by your health care provider. Make sure you discuss any questions you have with your health care provider. Document Released: 02/05/2009 Document Revised: 09/17/2015 Document Reviewed: 05/16/2014 Elsevier Interactive Patient Education  2017 [employer].

## 2019-05-16 NOTE — Telephone Encounter (Signed)
Patient's daughter called in regards to patient's refills  She stated that they have not received the medication from the mail order pharmacy and she will be out of medication on Saturday. They would like to know if they could be sent into a local pharmacy for pick up .   Sertraline  Amlodipine    [facility]

## 2019-05-16 NOTE — Telephone Encounter (Signed)
Noted  

## 2019-05-16 NOTE — Telephone Encounter (Signed)
Patient's daughter returned phone call. She states mail order pharmacy they use is [facility].   Thanks!

## 2019-05-16 NOTE — Progress Notes (Signed)
PCP notes:  Health Maintenance: Needs pneumovax 23 Needs Dexa scan   Abnormal Screenings: MMSE score- 18   Patient concerns: none   Nurse concerns: none   Next PCP appt.: 05/23/2019 @ 12 pm

## 2019-05-16 NOTE — Telephone Encounter (Signed)
Sent refills to [facility].  Lvm asking for call back.  Need to get clarification on which mail order pharmacy pt uses.  Ins card says [facility].

## 2019-05-16 NOTE — Progress Notes (Signed)
Subjective:   [place] R Michelli is a 86 y.o. female who presents for Medicare Annual (Subsequent) preventive examination.  Review of Systems: N/A   This visit is being conducted through telemedicine via telephone at the nurse health advisor's home address due to the COVID-19 pandemic. This patient has given me verbal consent via doximity to conduct this visit, patient states they are participating from their home address. Patient and myself are on the telephone call. There is no referral for this visit. Some vital signs may be absent or patient reported.    Patient identification: identified by name, DOB, and current address   Cardiac Risk Factors include: advanced age (>48men, >47 women);hypertension;dyslipidemia     Objective:     Vitals: There were no vitals taken for this visit.  There is no height or weight on file to calculate BMI.  Advanced Directives 05/16/2019 03/16/2018 01/20/2017 12/16/2015 12/16/2015  Does Patient Have a Medical Advance Directive? Yes No No - Yes  Type of Estate agent of [place];Living will - - - Public affairs consultant of [place];Living will  Does patient want to make changes to medical advance directive? - - - - No - Patient declined  Copy of Healthcare Power of Attorney in Chart? Yes - validated most recent copy scanned in chart (See row information) - - (No Data) No - copy requested    Tobacco Social History   Tobacco Use  Smoking Status Former Smoker  . Quit date: 04/25/1978  . Years since quitting: 41.0  Smokeless Tobacco Never Used     Counseling given: Not Answered   Clinical Intake:  Pre-visit preparation completed: Yes  Pain : No/denies pain     Nutritional Risks: None Diabetes: No  How often do you need to have someone help you when you read instructions, pamphlets, or other written materials from your doctor or pharmacy?: 1 - Never What is the last grade level you completed in school?: 8th  Interpreter Needed?:  No  Information entered by :: CJohnson, LPN  Past Medical History:  Diagnosis Date  . Finger wound, simple, open, subsequent encounter 01/24/2017  . Heart murmur new 2017?  Marland Kitchen History of colon cancer   . HLD (hyperlipidemia)   . Hyperglycemia   . Hypertension   . Osteoarthritis    knees  . Varicose veins of lower extremities with other complications    Past Surgical History:  Procedure Laterality Date  . ABDOMINAL HYSTERECTOMY  remote   fibroids  . COLON SURGERY  1999   colon cancer  . COLONOSCOPY  12/2008   TAx1 Evette Cristal)  . EYE SURGERY  2010  . INGUINAL HERNIA REPAIR  2001  . VEIN SURGERY Left 2008   Family History  Problem Relation Age of Onset  . Cancer Father        colon  . Cancer Sister        breast  . Cancer Brother        pancreas  . Cancer Brother        colon  . Diabetes Sister   . Diabetes Other        nephews  . Heart disease Sister   . Stroke Mother   . Hypertension Mother   . Hypertension Other        multiple  . CAD Neg Hx    Social History   Socioeconomic History  . Marital status: Married    Spouse name: Not on file  . Number of children: Not on  file  . Years of education: Not on file  . Highest education level: Not on file  Occupational History  . Not on file  Tobacco Use  . Smoking status: Former Smoker    Quit date: 04/25/1978    Years since quitting: 41.0  . Smokeless tobacco: Never Used  Substance and Sexual Activity  . Alcohol use: No    Alcohol/week: 0.0 standard drinks  . Drug use: No  . Sexual activity: Never  Other Topics Concern  . Not on file  Social History Narrative   Lives with husband   Occ: retired, worked in Education administrator at [employer]   Activity: enjoys walking    Diet: good water, fruits/vegetables daily    Social Determinants of Radio broadcast assistant Strain: Low Risk   . Difficulty of Paying Living Expenses: Not hard at all  Food Insecurity: No Food Insecurity  . Worried About Sales executive in the Last Year: Never true  . Ran Out of Food in the Last Year: Never true  Transportation Needs: No Transportation Needs  . Lack of Transportation (Medical): No  . Lack of Transportation (Non-Medical): No  Physical Activity: Inactive  . Days of Exercise per Week: 0 days  . Minutes of Exercise per Session: 0 min  Stress: No Stress Concern Present  . Feeling of Stress : Not at all  Social Connections:   . Frequency of Communication with Friends and Family: Not on file  . Frequency of Social Gatherings with Friends and Family: Not on file  . Attends Religious Services: Not on file  . Active Member of Clubs or Organizations: Not on file  . Attends Archivist Meetings: Not on file  . Marital Status: Not on file    Outpatient Encounter Medications as of 05/16/2019  Medication Sig  . amLODipine (NORVASC) 10 MG tablet Take 1 tablet (10 mg total) by mouth daily.  . cyanocobalamin 500 MCG tablet Take 500 mcg by mouth daily.  Marland Kitchen docusate sodium (COLACE) 100 MG capsule Take 200 mg by mouth daily.  Marland Kitchen donepezil (ARICEPT) 10 MG tablet TAKE 1 TABLET BY MOUTH AT BEDTIME *NEW DOSE*  . LORazepam (ATIVAN) 0.5 MG tablet TAKE 1/2 TO 1 TABLET BY MOUTH TWICE DAILY AS NEEDED FOR ANXIETY  . magnesium hydroxide (MILK OF MAGNESIA) 400 MG/5ML suspension Take by mouth as needed for mild constipation.  . Melatonin 5 MG TABS Take 2 tablets (10 mg total) by mouth at bedtime.  . memantine (NAMENDA) 10 MG tablet TAKE 1 TABLET TWICE A DAY  . metoprolol succinate (TOPROL-XL) 25 MG 24 hr tablet Take 1 tablet (25 mg total) by mouth daily.  . mirtazapine (REMERON) 15 MG tablet Take 0.5 tablets (7.5 mg total) by mouth at bedtime.  . naproxen sodium (ANAPROX) 220 MG tablet Take 220 mg by mouth as needed (pain).   Marland Kitchen OLANZapine (ZYPREXA) 5 MG tablet Take 1 tablet (5 mg total) by mouth at bedtime.  . sertraline (ZOLOFT) 100 MG tablet Take 1 tablet (100 mg total) by mouth daily.   No facility-administered  encounter medications on file as of 05/16/2019.    Activities of Daily Living In your present state of health, do you have any difficulty performing the following activities: 05/16/2019  Hearing? N  Vision? N  Difficulty concentrating or making decisions? N  Walking or climbing stairs? N  Dressing or bathing? N  Doing errands, shopping? N  Preparing Food and eating ? N  Using the Toilet?  N  In the past six months, have you accidently leaked urine? N  Do you have problems with loss of bowel control? N  Managing your Medications? N  Managing your Finances? N  Housekeeping or managing your Housekeeping? N  Some recent data might be hidden    Patient Care Team: Eustaquio Boyden, MD as PCP - General (Family Medicine) Kieth Brightly, MD (General Surgery)    Assessment:   This is a routine wellness examination for [place].  Exercise Activities and Dietary recommendations Current Exercise Habits: Home exercise routine, Type of exercise: walking, Time (Minutes): 20, Frequency (Times/Week): 4, Weekly Exercise (Minutes/Week): 80, Intensity: Mild, Exercise limited by: None identified  Goals    . Increase water intake     Starting 12/16/2015, I will attempt to drink at least 6-8 glasses of water daily.     . Patient Stated     05/16/2019, I will maintain and continue medications as prescribed.       Fall Risk Fall Risk  05/16/2019 02/09/2018 01/27/2017 12/16/2015  Falls in the past year? 0 No Yes No  Number falls in past yr: 0 - 1 -  Injury with Fall? 0 - Yes -  Comment - - 06/2016, fell on porch -  Risk for fall due to : Medication side effect - - -  Follow up Falls evaluation completed;Falls prevention discussed - - -   Is the patient's home free of loose throw rugs in walkways, pet beds, electrical cords, etc?   yes      Grab bars in the bathroom? yes      Handrails on the stairs?   yes      Adequate lighting?   yes  Timed Get Up and Go performed: N/A  Depression  Screen PHQ 2/9 Scores 05/16/2019 02/09/2018 01/27/2017 12/16/2015  PHQ - 2 Score 0 0 4 1  PHQ- 9 Score 0 - 6 -     Cognitive Function MMSE - Mini Mental State Exam 05/16/2019 07/10/2016 12/16/2015  Not completed: - - (No Data)  Orientation to time 3 1 -  Orientation to Place 5 3 -  Registration 3 3 -  Attention/ Calculation 4 4 -  Recall 2 0 -  Language- name 2 objects - 2 -  Language- repeat 1 1 -  Language- follow 3 step command - 3 -  Language- read & follow direction - 1 -  Write a sentence - 1 -  Copy design - 0 -  Total score - 19 -  Mini Cog  Mini-Cog screen was completed. Maximum score is 22. A value of 0 denotes this part of the MMSE was not completed or the patient failed this part of the Mini-Cog screening.       Immunization History  Administered Date(s) Administered  . Fluad Quad(high Dose 65+) 01/31/2019  . Influenza, Seasonal, Injecte, Preservative Fre 04/26/2015  . Influenza,inj,Quad PF,6+ Mos 12/23/2015, 01/24/2017, 02/09/2018  . Pneumococcal Conjugate-13 08/10/2017  . Tdap 01/20/2017    Qualifies for Shingles Vaccine? yes  Screening Tests Health Maintenance  Topic Date Due  . DTAP VACCINES (1) 01/23/1927  . DEXA SCAN  11/22/1991  . PNA vac Low Risk Adult (2 of 2 - PPSV23) 08/11/2018  . DTaP/Tdap/Td (2 - Td) 01/21/2027  . TETANUS/TDAP  01/21/2027  . INFLUENZA VACCINE  Completed    Cancer Screenings: Lung: Low Dose CT Chest recommended if Age 7-80 years, 30 pack-year currently smoking OR have quit w/in 15 years. Patient does not qualify.  Breast:  Up to date on Mammogram? Yes, no longer required   Up to date of Bone Density/Dexa? No, Patient agrees to having test scheduled.  Colorectal: no longer required  Additional Screenings:  Hepatitis C Screening: N/A      Plan:    Patient will maintain and continue medications as prescribed.    I have personally reviewed and noted the following in the patient's chart:   . Medical and social history .  Use of alcohol, tobacco or illicit drugs  . Current medications and supplements . Functional ability and status . Nutritional status . Physical activity . Advanced directives . List of other physicians . Hospitalizations, surgeries, and ER visits in previous 12 months . Vitals . Screenings to include cognitive, depression, and falls . Referrals and appointments  In addition, I have reviewed and discussed with patient certain preventive protocols, quality metrics, and best practice recommendations. A written personalized care plan for preventive services as well as general preventive health recommendations were provided to patient.     Janalyn Shy, LPN  10/22/5282

## 2019-05-17 ENCOUNTER — Ambulatory Visit: Payer: Medicare Other

## 2019-05-17 LAB — RPR: RPR Ser Ql: NONREACTIVE

## 2019-05-23 ENCOUNTER — Ambulatory Visit (INDEPENDENT_AMBULATORY_CARE_PROVIDER_SITE_OTHER): Payer: Medicare Other | Admitting: Family Medicine

## 2019-05-23 ENCOUNTER — Encounter: Payer: Self-pay | Admitting: Family Medicine

## 2019-05-23 ENCOUNTER — Other Ambulatory Visit: Payer: Self-pay

## 2019-05-23 VITALS — BP 124/66 | HR 62 | Temp 97.1°F | Ht 62.0 in | Wt 134.3 lb

## 2019-05-23 DIAGNOSIS — Z0001 Encounter for general adult medical examination with abnormal findings: Secondary | ICD-10-CM

## 2019-05-23 DIAGNOSIS — F05 Delirium due to known physiological condition: Secondary | ICD-10-CM

## 2019-05-23 DIAGNOSIS — Z515 Encounter for palliative care: Secondary | ICD-10-CM

## 2019-05-23 DIAGNOSIS — M159 Polyosteoarthritis, unspecified: Secondary | ICD-10-CM

## 2019-05-23 DIAGNOSIS — F0391 Unspecified dementia with behavioral disturbance: Secondary | ICD-10-CM

## 2019-05-23 DIAGNOSIS — Z23 Encounter for immunization: Secondary | ICD-10-CM | POA: Diagnosis not present

## 2019-05-23 DIAGNOSIS — R011 Cardiac murmur, unspecified: Secondary | ICD-10-CM

## 2019-05-23 DIAGNOSIS — R7303 Prediabetes: Secondary | ICD-10-CM

## 2019-05-23 DIAGNOSIS — R634 Abnormal weight loss: Secondary | ICD-10-CM

## 2019-05-23 DIAGNOSIS — E785 Hyperlipidemia, unspecified: Secondary | ICD-10-CM

## 2019-05-23 DIAGNOSIS — M8949 Other hypertrophic osteoarthropathy, multiple sites: Secondary | ICD-10-CM

## 2019-05-23 DIAGNOSIS — I10 Essential (primary) hypertension: Secondary | ICD-10-CM

## 2019-05-23 NOTE — Assessment & Plan Note (Deleted)
Discussed tylenol/aleve use. 

## 2019-05-23 NOTE — Patient Instructions (Addendum)
Pneumovax today Blood pressure was too high today! Start monitoring more closely at home - a few times a week, and call me with readings in 1-2 weeks.  Return as needed or in 6 months for follow up visit.   Health Maintenance After Age 84 After age 11, you are at a higher risk for certain long-term diseases and infections as well as injuries from falls. Falls are a major cause of broken bones and head injuries in people who are older than age 8. Getting regular preventive care can help to keep you healthy and well. Preventive care includes getting regular testing and making lifestyle changes as recommended by your health care provider. Talk with your health care provider about:  Which screenings and tests you should have. A screening is a test that checks for a disease when you have no symptoms.  A diet and exercise plan that is right for you. What should I know about screenings and tests to prevent falls? Screening and testing are the best ways to find a health problem early. Early diagnosis and treatment give you the best chance of managing medical conditions that are common after age 28. Certain conditions and lifestyle choices may make you more likely to have a fall. Your health care provider may recommend:  Regular vision checks. Poor vision and conditions such as cataracts can make you more likely to have a fall. If you wear glasses, make sure to get your prescription updated if your vision changes.  Medicine review. Work with your health care provider to regularly review all of the medicines you are taking, including over-the-counter medicines. Ask your health care provider about any side effects that may make you more likely to have a fall. Tell your health care provider if any medicines that you take make you feel dizzy or sleepy.  Osteoporosis screening. Osteoporosis is a condition that causes the bones to get weaker. This can make the bones weak and cause them to break more  easily.  Blood pressure screening. Blood pressure changes and medicines to control blood pressure can make you feel dizzy.  Strength and balance checks. Your health care provider may recommend certain tests to check your strength and balance while standing, walking, or changing positions.  Foot health exam. Foot pain and numbness, as well as not wearing proper footwear, can make you more likely to have a fall.  Depression screening. You may be more likely to have a fall if you have a fear of falling, feel emotionally low, or feel unable to do activities that you used to do.  Alcohol use screening. Using too much alcohol can affect your balance and may make you more likely to have a fall. What actions can I take to lower my risk of falls? General instructions  Talk with your health care provider about your risks for falling. Tell your health care provider if: ? You fall. Be sure to tell your health care provider about all falls, even ones that seem minor. ? You feel dizzy, sleepy, or off-balance.  Take over-the-counter and prescription medicines only as told by your health care provider. These include any supplements.  Eat a healthy diet and maintain a healthy weight. A healthy diet includes low-fat dairy products, low-fat (lean) meats, and fiber from whole grains, beans, and lots of fruits and vegetables. Home safety  Remove any tripping hazards, such as rugs, cords, and clutter.  Install safety equipment such as grab bars in bathrooms and safety rails on stairs.  Keep  rooms and walkways well-lit. Activity   Follow a regular exercise program to stay fit. This will help you maintain your balance. Ask your health care provider what types of exercise are appropriate for you.  If you need a cane or walker, use it as recommended by your health care provider.  Wear supportive shoes that have nonskid soles. Lifestyle  Do not drink alcohol if your health care provider tells you not to  drink.  If you drink alcohol, limit how much you have: ? 0-1 drink a day for women. ? 0-2 drinks a day for men.  Be aware of how much alcohol is in your drink. In the [place] one drink equals one typical bottle of beer (12 oz), one-half glass of wine (5 oz), or one shot of hard liquor (1 oz).  Do not use any products that contain nicotine or tobacco, such as cigarettes and e-cigarettes. If you need help quitting, ask your health care provider. Summary  Having a healthy lifestyle and getting preventive care can help to protect your health and wellness after age 64.  Screening and testing are the best way to find a health problem early and help you avoid having a fall. Early diagnosis and treatment give you the best chance for managing medical conditions that are more common for people who are older than age 25.  Falls are a major cause of broken bones and head injuries in people who are older than age 34. Take precautions to prevent a fall at home.  Work with your health care provider to learn what changes you can make to improve your health and wellness and to prevent falls. This information is not intended to replace advice given to you by your health care provider. Make sure you discuss any questions you have with your health care provider. Document Revised: 08/02/2018 Document Reviewed: 02/22/2017 Elsevier Patient Education  2020 [employer].

## 2019-05-23 NOTE — Assessment & Plan Note (Addendum)
Stable period on aricept, namenda, as well as sertraline 100mg , lorazepam, melatonin, zyprexa and remeron for sleep and neuropsychiatric symptoms. ?senile vs vascular vs alzheimer's.

## 2019-05-23 NOTE — Assessment & Plan Note (Addendum)
Watch added sugars in diet.

## 2019-05-23 NOTE — Assessment & Plan Note (Addendum)
Chronic, not on statin. Given age, no indication to start. Encouraged low chol diet.  The ASCVD Risk score Denman George DC Jr., et al., 2013) failed to calculate for the following reasons:   The 2013 ASCVD risk score is only valid for ages 76 to 23

## 2019-05-23 NOTE — Assessment & Plan Note (Signed)
Chronic, overall stable. Consider update echo if symptoms develop.

## 2019-05-23 NOTE — Assessment & Plan Note (Signed)
Not using aleve.

## 2019-05-23 NOTE — Assessment & Plan Note (Addendum)
Chronic, deteriorated despite compliance with current regimen (amlodipine 10mg  and toprol XL 25mg ). Very high today. Asymptomatic. I have asked them to monitor BP closely at home and call me in 1-2 wks with updated readings. If staying high, would likely add 3rd medication to regimen. Daughter agrees with plan.

## 2019-05-25 NOTE — Assessment & Plan Note (Addendum)
Stable period. Continue lorazepam, melatonin, zyprexa and remeron.

## 2019-05-25 NOTE — Assessment & Plan Note (Signed)
Preventative protocols reviewed and updated unless pt declined. Discussed healthy diet and lifestyle.  

## 2019-05-25 NOTE — Assessment & Plan Note (Addendum)
[facility] was involved - but I received notice that family cancelled services 12/2018.

## 2019-05-25 NOTE — Progress Notes (Signed)
This visit was conducted in person.  BP 124/66   Pulse 62   Temp (!) 97.1 F (36.2 C)   Ht 5\' 2"  (1.575 m)   Wt 134 lb 4.8 oz (60.9 kg)   SpO2 96%   BMI 24.56 kg/m    220/90 on left 212/100 on right  CC: CPE Subjective:    Patient ID: Erika Weiss, female    DOB: 12/05/1924, 86 y.o.   MRN: [id number]  HPI: [id number] Erika Weiss is a 86 y.o. female presenting on 05/23/2019 for Annual Exam (part 2)    Saw health advisor last week for medicare wellness visit. Note reviewed.    No exam data present    Clinical Support from 05/16/2019 in [facility] Total Score  0      Fall Risk  05/16/2019 02/09/2018 01/27/2017 12/16/2015  Falls in the past year? 0 No Yes No  Number falls in past yr: 0 - 1 -  Injury with Fall? 0 - Yes -  Comment - - 06/2016, fell on porch -  Risk for fall due to : Medication side effect - - -  Follow up Falls evaluation completed;Falls prevention discussed - - -      [facility] palliative care stopped coming out to the house. Has home BP cuff but has not checked recently.   Preventative: Preventative healthcare - largely aged out. H/o colon cancer, last colonoscopy 2010.  Flu shot yearly Tdap 12/2016 Prevnar 07/2017, pneumovax today Covid vaccine - considering Advanced planning - reviewed, scanned 02/2017. HCPOA are 03/2017 and Cora Collum. Does not want prolonged life support if terminal condition. Does want tube feeds. Wants HCPOA decisions to supercede advanced directive. Seat belt use discussed.  No changing moles on skin.  Non smoker  Alcohol - none  Dentist - has dentures  Eye exam - overdue - no vision trouble Bowel - no constipation - on colace Bladder - no incontinence   Lives with husband Both suffer from dementia Occ: retired, worked in [employer] at [employer] Activity:no regular exercise Diet: good water, fruits/vegetables daily     Relevant past medical, surgical, family and  social history reviewed and updated as indicated. Interim medical history since our last visit reviewed. Allergies and medications reviewed and updated. Outpatient Medications Prior to Visit  Medication Sig Dispense Refill  . amLODipine (NORVASC) 10 MG tablet Take 1 tablet (10 mg total) by mouth daily. 30 tablet 0  . cyanocobalamin 500 MCG tablet Take 500 mcg by mouth daily.    [employer] docusate sodium (COLACE) 100 MG capsule Take 200 mg by mouth daily.    Marland Kitchen donepezil (ARICEPT) 10 MG tablet TAKE 1 TABLET BY MOUTH AT BEDTIME *NEW DOSE* 30 tablet 0  . LORazepam (ATIVAN) 0.5 MG tablet TAKE 1/2 TO 1 TABLET BY MOUTH TWICE DAILY AS NEEDED FOR ANXIETY 40 tablet 0  . magnesium hydroxide (MILK OF MAGNESIA) 400 MG/5ML suspension Take by mouth as needed for mild constipation.    . Melatonin 5 MG TABS Take 2 tablets (10 mg total) by mouth at bedtime.  0  . memantine (NAMENDA) 10 MG tablet TAKE 1 TABLET TWICE A DAY 180 tablet 3  . metoprolol succinate (TOPROL-XL) 25 MG 24 hr tablet Take 1 tablet (25 mg total) by mouth daily. 30 tablet 0  . mirtazapine (REMERON) 15 MG tablet Take 0.5 tablets (7.5 mg total) by mouth at bedtime. 30 tablet 0  . naproxen sodium (ANAPROX) 220 MG tablet  Take 220 mg by mouth as needed (pain).     Marland Kitchen OLANZapine (ZYPREXA) 5 MG tablet Take 1 tablet (5 mg total) by mouth at bedtime. 30 tablet 0  . sertraline (ZOLOFT) 100 MG tablet Take 1 tablet (100 mg total) by mouth daily. 30 tablet 0   No facility-administered medications prior to visit.     Per HPI unless specifically indicated in ROS section below Review of Systems Objective:    BP 124/66   Pulse 62   Temp (!) 97.1 F (36.2 C)   Ht 5\' 2"  (1.575 m)   Wt 134 lb 4.8 oz (60.9 kg)   SpO2 96%   BMI 24.56 kg/m   Wt Readings from Last 3 Encounters:  05/23/19 134 lb 4.8 oz (60.9 kg)  11/09/18 116 lb 6 oz (52.8 kg)  04/24/18 119 lb 8 oz (54.2 kg)    Physical Exam Vitals and nursing note reviewed.  Constitutional:      General:  She is not in acute distress.    Appearance: Normal appearance. She is well-developed. She is not ill-appearing.  HENT:     Head: Normocephalic and atraumatic.     Right Ear: Hearing, tympanic membrane, ear canal and external ear normal.     Left Ear: Hearing, tympanic membrane, ear canal and external ear normal.     Mouth/Throat:     Pharynx: Uvula midline.  Eyes:     General: No scleral icterus.    Extraocular Movements: Extraocular movements intact.     Conjunctiva/sclera: Conjunctivae normal.     Pupils: Pupils are equal, round, and reactive to light.  Cardiovascular:     Rate and Rhythm: Normal rate and regular rhythm.     Pulses: Normal pulses.          Radial pulses are 2+ on the right side and 2+ on the left side.     Heart sounds: Murmur (systolic) present.  Pulmonary:     Effort: Pulmonary effort is normal. No respiratory distress.     Breath sounds: Normal breath sounds. No wheezing, rhonchi or rales.  Abdominal:     General: Abdomen is flat. Bowel sounds are normal. There is no distension.     Palpations: Abdomen is soft. There is no mass.     Tenderness: There is no abdominal tenderness. There is no guarding or rebound.     Hernia: No hernia is present.  Musculoskeletal:        General: Normal range of motion.     Cervical back: Normal range of motion and neck supple.     Right lower leg: No edema.     Left lower leg: No edema.  Lymphadenopathy:     Cervical: No cervical adenopathy.  Skin:    General: Skin is warm and dry.     Findings: No rash.  Neurological:     General: No focal deficit present.     Mental Status: She is alert and oriented to person, place, and time.     Comments: CN grossly intact, station and gait intact  Psychiatric:        Mood and Affect: Mood normal.        Behavior: Behavior normal.        Thought Content: Thought content normal.        Judgment: Judgment normal.       Results for orders placed or performed in visit on 05/16/19    RPR  Result Value Ref Range   RPR Ser  Ql NON-REACTIVE NON-REACTI  Hemoglobin A1c  Result Value Ref Range   Hgb A1c MFr Bld 5.9 4.6 - 6.5 %  Comprehensive metabolic panel  Result Value Ref Range   Sodium 141 135 - 145 mEq/L   Potassium 4.2 3.5 - 5.1 mEq/L   Chloride 109 96 - 112 mEq/L   CO2 25 19 - 32 mEq/L   Glucose, Bld 159 (H) 70 - 99 mg/dL   BUN 27 (H) 6 - 23 mg/dL   Creatinine, Ser 6.06 0.40 - 1.20 mg/dL   Total Bilirubin 0.3 0.2 - 1.2 mg/dL   Alkaline Phosphatase 96 39 - 117 U/L   AST 19 0 - 37 U/L   ALT 8 0 - 35 U/L   Total Protein 7.2 6.0 - 8.3 g/dL   Albumin 4.0 3.5 - 5.2 g/dL   GFR 30.16 (L) >01.09 mL/min   Calcium 9.5 8.4 - 10.5 mg/dL  Lipid panel  Result Value Ref Range   Cholesterol 242 (H) 0 - 200 mg/dL   Triglycerides 323.5 0.0 - 149.0 mg/dL   HDL 57.32 >20.25 mg/dL   VLDL 42.7 0.0 - 06.2 mg/dL   LDL Cholesterol 376 (H) 0 - 99 mg/dL   Total CHOL/HDL Ratio 4    NonHDL 175.76   Microalbumin / creatinine urine ratio  Result Value Ref Range   Microalb, Ur 10.8 (H) 0.0 - 1.9 mg/dL   Creatinine,U 283.1 mg/dL   Microalb Creat Ratio 7.8 0.0 - 30.0 mg/g   Assessment & Plan:  This visit occurred during the SARS-CoV-2 public health emergency.  Safety protocols were in place, including screening questions prior to the visit, additional usage of staff PPE, and extensive cleaning of exam room while observing appropriate contact time as indicated for disinfecting solutions.   Problem List Items Addressed This Visit    RESOLVED: Weight loss    Actually weight gain noted ~20 lbs. Will resolve      Systolic murmur    Chronic, overall stable. Consider update echo if symptoms develop.       Sundowning    Stable period. Continue lorazepam, melatonin, zyprexa and remeron.       Prediabetes    Watch added sugars in diet.       Palliative care status    [facility] was involved - but I received notice that family cancelled services 12/2018.       Osteoarthritis     Not using aleve.       Hypertension    Chronic, deteriorated despite compliance with current regimen (amlodipine 10mg  and toprol XL 25mg ). Very high today. Asymptomatic. I have asked them to monitor BP closely at home and call me in 1-2 wks with updated readings. Daughter agrees with plan.       HLD (hyperlipidemia)    Chronic, not on statin. Given age, no indication to start. Encouraged low chol diet.  The ASCVD Risk score DC Jr., et al., 2013) failed to calculate for the following reasons:   The 2013 ASCVD risk score is only valid for ages 5 to 66       Encounter for general adult medical examination with abnormal findings - Primary    Preventative protocols reviewed and updated unless pt declined. Discussed healthy diet and lifestyle.       Dementia (HCC)    Stable period on aricept, namenda, as well as sertraline 100mg , lorazepam, melatonin, zyprexa and remeron for sleep and neuropsychiatric symptoms. ?senile vs vascular vs alzheimer's.  Other Visit Diagnoses    Need for 23-polyvalent pneumococcal polysaccharide vaccine       Relevant Orders   Pneumococcal polysaccharide vaccine 23-valent greater than or equal to 2yo subcutaneous/IM (Completed)       No orders of the defined types were placed in this encounter.  Orders Placed This Encounter  Procedures  . Pneumococcal polysaccharide vaccine 23-valent greater than or equal to 2yo subcutaneous/IM    Patient instructions; Pneumovax today Blood pressure was too high today! Start monitoring more closely at home - a few times a week, and call me with readings in 1-2 weeks.  Return as needed or in 6 months for follow up visit.   Follow up plan: Return in about 6 months (around 11/20/2019) for follow up visit.  Eustaquio Boyden, MD

## 2019-05-25 NOTE — Assessment & Plan Note (Signed)
Actually weight gain noted ~20 lbs. Will resolve

## 2019-05-29 ENCOUNTER — Other Ambulatory Visit: Payer: Self-pay | Admitting: Family Medicine

## 2019-05-29 MED ORDER — LORAZEPAM 0.5 MG PO TABS: 0.2500 mg | ORAL_TABLET | Freq: Two times a day (BID) | ORAL | 0 refills | Status: DC | PRN

## 2019-05-29 NOTE — Telephone Encounter (Signed)
ERx 

## 2019-05-29 NOTE — Telephone Encounter (Signed)
Patient's daughter called to requesting refill for the patient   LORAZEPAM    [facility]

## 2019-05-29 NOTE — Telephone Encounter (Signed)
Last refilled on 04/29/2019 #40 with 0 refill  LOV 05/23/2019 CPE Next appointment on 11/21/2019

## 2019-07-04 ENCOUNTER — Telehealth: Payer: Self-pay

## 2019-07-04 NOTE — Telephone Encounter (Signed)
If remeron 7.5mg  not helping with sleep, could consider increasing zyprexa (olanzapine) to 10mg  at night for sleep and agitation. This would be highest we should go. Let me know if interested in this.  Continue other medicines - melatonin, sertraline, aricept, namenda, ativan.  Ensure good bowel movements and no signs of GI distress

## 2019-07-04 NOTE — Telephone Encounter (Signed)
[name] said(DPR signed) pt may sleep couple of hours but then can be awake all night; pt taking mirtazapine 15 mg taking 1/2 tab at hs.[name] said med does not seem to be working now. Pt is more agitated and wants to go to work and wants to cook but has not cooked in years; someone with pt all the time but [name] said pt is fall risk. Pt last seen 05/23/19 for annual. [place] [facility]; [name] said pt is going to urinate more frequently. No strong odor of urine and pt does not complain of pain or burning when urinates. [name] request cb after Dr Reece Agar reviews note.

## 2019-07-04 NOTE — Telephone Encounter (Signed)
LVM

## 2019-07-05 ENCOUNTER — Telehealth: Payer: Self-pay | Admitting: Family Medicine

## 2019-07-05 MED ORDER — OLANZAPINE 7.5 MG PO TABS: 7.5000 mg | ORAL_TABLET | Freq: Every day | ORAL | 1 refills | Status: DC

## 2019-07-05 NOTE — Telephone Encounter (Signed)
Erika Weiss @ [facility] pharmacy called about a scrip they received for  OLANZapine (ZYPREXA) 7.5 MG tablet  They stated that it was sent as a quantity of 3 tablets  They wanted to verify if this should have been 30 tablets instead,  If so they would like a new script with the update, for 30 tablets

## 2019-07-05 NOTE — Telephone Encounter (Signed)
Noted. New Rx sent in.

## 2019-07-05 NOTE — Telephone Encounter (Signed)
Spoke with Debra relaying Dr. Timoteo Expose message.  Verbalizes understanding and agrees to trying increased Zyprexa.  States pt is having good BMs.  Gives permission to lvm with Dr. Timoteo Expose response.

## 2019-07-05 NOTE — Telephone Encounter (Addendum)
Let's slowly increase - to zyprexa 7.5mg  nightly (instead of 10mg ) - sent to pharmacy.  Update after 2 wks with effect.

## 2019-07-05 NOTE — Addendum Note (Signed)
Addended by: Eustaquio Boyden on: 07/05/2019 01:37 PM   Modules accepted: Orders

## 2019-07-05 NOTE — Telephone Encounter (Signed)
Spoke with Erika Weiss relaying Dr. Timoteo Expose message.  She verbalizes understanding and expresses her thanks.

## 2019-07-12 ENCOUNTER — Other Ambulatory Visit: Payer: Self-pay | Admitting: Family Medicine

## 2019-07-12 NOTE — Telephone Encounter (Signed)
Last refill 05/29/19, #40 x 0 refills Upcoming OV With Family Medicine Eustaquio Boyden, MD) 11/21/2019 at 12:15 PM  Please advise, thanks.

## 2019-07-15 ENCOUNTER — Other Ambulatory Visit: Payer: Self-pay

## 2019-07-15 NOTE — Telephone Encounter (Signed)
Spoke pt's daughter, Tyler Aas (on dpr), notifying her the refill was sent in.  Expresses her thanks.

## 2019-07-15 NOTE — Telephone Encounter (Signed)
Name of Medication: Lorazepam Name of Pharmacy: [facility] Last Fill or Written Date and Quantity: 05/29/19, #40 Last Office Visit and Type: 05/23/19, AWV prt 2 Next Office Visit and Type: 11/21/19, 6 mo f/u Last Controlled Substance Agreement Date: none Last UDS: none

## 2019-07-15 NOTE — Telephone Encounter (Signed)
Patient's daughter called about refill request. She stated the pharmacy is still waiting on a response and the patient is really needing this medication

## 2019-07-15 NOTE — Telephone Encounter (Signed)
Error

## 2019-07-15 NOTE — Telephone Encounter (Signed)
plz notify this was sent in. 

## 2019-07-15 NOTE — Telephone Encounter (Signed)
Plz notify sent in.  

## 2019-07-18 ENCOUNTER — Other Ambulatory Visit: Payer: Self-pay

## 2019-07-18 MED ORDER — METOPROLOL SUCCINATE ER 25 MG PO TB24: 25.0000 mg | ORAL_TABLET | Freq: Every day | ORAL | 3 refills | Status: AC

## 2019-07-18 NOTE — Telephone Encounter (Signed)
E-scribed refill 

## 2019-07-25 ENCOUNTER — Other Ambulatory Visit: Payer: Self-pay

## 2019-07-25 ENCOUNTER — Ambulatory Visit (INDEPENDENT_AMBULATORY_CARE_PROVIDER_SITE_OTHER)
Admission: RE | Admit: 2019-07-25 | Discharge: 2019-07-25 | Disposition: A | Discharge status: Home / Self Care | Payer: Medicare Other | Source: Ambulatory Visit | Attending: Family Medicine | Admitting: Family Medicine

## 2019-07-25 ENCOUNTER — Encounter: Payer: Self-pay | Admitting: Family Medicine

## 2019-07-25 ENCOUNTER — Ambulatory Visit (INDEPENDENT_AMBULATORY_CARE_PROVIDER_SITE_OTHER): Payer: Medicare Other | Admitting: Family Medicine

## 2019-07-25 VITALS — BP 150/80 | HR 55 | Temp 97.5°F | Ht 62.0 in | Wt 136.4 lb

## 2019-07-25 DIAGNOSIS — S6992XA Unspecified injury of left wrist, hand and finger(s), initial encounter: Secondary | ICD-10-CM

## 2019-07-25 DIAGNOSIS — Z515 Encounter for palliative care: Secondary | ICD-10-CM

## 2019-07-25 DIAGNOSIS — F05 Delirium due to known physiological condition: Secondary | ICD-10-CM

## 2019-07-25 DIAGNOSIS — R2689 Other abnormalities of gait and mobility: Secondary | ICD-10-CM | POA: Diagnosis not present

## 2019-07-25 DIAGNOSIS — F0391 Unspecified dementia with behavioral disturbance: Secondary | ICD-10-CM

## 2019-07-25 NOTE — Assessment & Plan Note (Signed)
Anticipate hematoma after hitting dorsal hand against hard object. Supportive care reviewed - elevation, cold then heat treatment, tylenol PRN discomfort. Check xray to r/o fracture.

## 2019-07-25 NOTE — Progress Notes (Signed)
This visit was conducted in person.  BP (!) 150/80 (BP Location: Right Arm, Patient Position: Sitting, Cuff Size: Normal)   Pulse (!) 55   Temp (!) 97.5 F (36.4 C) (Temporal)   Ht 5\' 2"  (1.575 m)   Wt 136 lb 7 oz (61.9 kg)   SpO2 97%   BMI 24.95 kg/m   BP Readings from Last 3 Encounters:  07/25/19 (!) 150/80  05/23/19 124/66  11/14/18 132/68    CC: wrist injury Subjective:    Patient ID: [id number] Erika Dekay, female    DOB: 10/25/1924, 86 y.o.   MRN: [id number]  HPI: [id number] Erika Weiss is a 86 y.o. female presenting on 07/25/2019 for Hand Injury (Bumped posterior left hand at home on 07/24/19.  C/o some tenderness, swelling and bruising.  Pt accompanied by daughter, 07/26/19- temp 97.9.)   DOI: 07/24/2019 - unknown mechanism of injury.  Restless night on Wednesday morning Hit L dorsal hand on something with residual bruise and swelling.  States not hurting.  Treated with ice to skin as well as neosporin and some bruise medicine.   Good appetite. 2-3 BM per day.   Ongoing struggle at home with sun downing, increased agitation at night.  This is despite Namenda, lorazepam, donepezil, sertraline, melatonin 10mg , remeron, and zyprexa. Discussed if ongoing trouble, next step is to start decreasing medications.   Requests new referral for palliative care.  They cancelled palliative care services 12/2018 - states her father 01/2019) was getting overwhelmed when nurse aid was coming to the house 3 times a week.  They currently have personal care nurse aide coming out to their house once weekly - Tyler Aas is happy with this.      Relevant past medical, surgical, family and social history reviewed and updated as indicated. Interim medical history since our last visit reviewed. Allergies and medications reviewed and updated. Outpatient Medications Prior to Visit  Medication Sig Dispense Refill  . amLODipine (NORVASC) 10 MG tablet Take 1 tablet (10 mg total) by mouth daily. 30 tablet 0    . cyanocobalamin 500 MCG tablet Take 500 mcg by mouth daily.    [employer] docusate sodium (COLACE) 100 MG capsule Take 200 mg by mouth daily.    [employer] donepezil (ARICEPT) 10 MG tablet TAKE 1 TABLET BY MOUTH AT BEDTIME *NEW DOSE* 30 tablet 0  . LORazepam (ATIVAN) 0.5 MG tablet TAKE 0.5 TO 1 TABLET BY MOUTH (0.25-0.5 MG TOTAL) BY MOUTH TWICE DAILIY AS NEEDED FOR ANXIETY 40 tablet 1  . magnesium hydroxide (MILK OF MAGNESIA) 400 MG/5ML suspension Take by mouth as needed for mild constipation.    . Melatonin 5 MG TABS Take 2 tablets (10 mg total) by mouth at bedtime.  0  . memantine (NAMENDA) 10 MG tablet TAKE 1 TABLET TWICE A DAY 180 tablet 3  . metoprolol succinate (TOPROL-XL) 25 MG 24 hr tablet Take 1 tablet (25 mg total) by mouth daily. 90 tablet 3  . mirtazapine (REMERON) 15 MG tablet Take 0.5 tablets (7.5 mg total) by mouth at bedtime. 30 tablet 0  . naproxen sodium (ANAPROX) 220 MG tablet Take 220 mg by mouth as needed (pain).     Marland Kitchen OLANZapine (ZYPREXA) 7.5 MG tablet Take 1 tablet (7.5 mg total) by mouth at bedtime. 30 tablet 1  . sertraline (ZOLOFT) 100 MG tablet Take 1 tablet (100 mg total) by mouth daily. 30 tablet 0   No facility-administered medications prior to visit.     Per HPI unless specifically  indicated in ROS section below Review of Systems Objective:    BP (!) 150/80 (BP Location: Right Arm, Patient Position: Sitting, Cuff Size: Normal)   Pulse (!) 55   Temp (!) 97.5 F (36.4 C) (Temporal)   Ht 5\' 2"  (1.575 m)   Wt 136 lb 7 oz (61.9 kg)   SpO2 97%   BMI 24.95 kg/m   Wt Readings from Last 3 Encounters:  07/25/19 136 lb 7 oz (61.9 kg)  05/23/19 134 lb 4.8 oz (60.9 kg)  11/09/18 116 lb 6 oz (52.8 kg)    Physical Exam Vitals and nursing note reviewed.  Constitutional:      Appearance: Normal appearance. She is not ill-appearing.  Musculoskeletal:        General: Swelling and tenderness present. Normal range of motion.     Comments:  2+ DP bilaterally Erika hand WNL L hand -  hematoma with surrounding bruising at soft tissue of dorsal L hand between 2nd/3rd MCP joints Discomfort to palpation at 2nd MCP and at hematoma Chronic hand stiffness due to known osteoarthritis  Skin:    General: Skin is warm and dry.     Findings: Bruising present.     Comments: No break in skin  Neurological:     Mental Status: She is alert.       Lab Results  Component Value Date   CREATININE 1.20 05/16/2019   BUN 27 (H) 05/16/2019   NA 141 05/16/2019   K 4.2 05/16/2019   CL 109 05/16/2019   CO2 25 05/16/2019    Lab Results  Component Value Date   WBC 10.3 11/09/2018   HGB 13.6 11/09/2018   HCT 41.2 11/09/2018   MCV 91.2 11/09/2018   PLT 236.0 11/09/2018    Assessment & Plan:  This visit occurred during the SARS-CoV-2 public health emergency.  Safety protocols were in place, including screening questions prior to the visit, additional usage of staff PPE, and extensive cleaning of exam room while observing appropriate contact time as indicated for disinfecting solutions.   Problem List Items Addressed This Visit    Sundowning   Relevant Orders   Amb Referral to Palliative Care   Palliative care status    Daughter 11/11/2018 requests to renew palliative care referral. New referral placed.       Relevant Orders   Amb Referral to Palliative Care   Imbalance   Relevant Orders   Amb Referral to Palliative Care   Hand injury, left, initial encounter - Primary    Anticipate hematoma after hitting dorsal hand against hard object. Supportive care reviewed - elevation, cold then heat treatment, tylenol PRN discomfort. Check xray to Erika/o fracture.      Relevant Orders   DG Hand Complete Left   Dementia (HCC)   Relevant Orders   Amb Referral to Palliative Care       No orders of the defined types were placed in this encounter.  Orders Placed This Encounter  Procedures  . DG Hand Complete Left    Standing Status:   Future    Number of Occurrences:   1    Standing  Expiration Date:   09/23/2020    Order Specific Question:   Reason for Exam (SYMPTOM  OR DIAGNOSIS REQUIRED)    Answer:   L dorsal hand injury    Order Specific Question:   Preferred imaging location?    Answer:   11/23/2020    Order Specific Question:   Radiology Contrast  Protocol - do NOT remove file path    Answer:   [email or web address]  . Amb Referral to Palliative Care    Referral Priority:   Routine    Referral Type:   Consultation    Number of Visits Requested:   1   Patient Instructions  Xray of left hand today Continue ice to hand (covered in a towel) for next 24-48 hours.  Then may transition to heat (covered in a towel) to help circulation. You have hematoma under the skin at the back left hand - this will take time to heal. We will ask palliative care to come out to the house for evaluation again.    Follow up plan: Return if symptoms worsen or fail to improve.  Ria Bush, MD

## 2019-07-25 NOTE — Patient Instructions (Addendum)
Xray of left hand today Continue ice to hand (covered in a towel) for next 24-48 hours.  Then may transition to heat (covered in a towel) to help circulation. You have hematoma under the skin at the back left hand - this will take time to heal. We will ask palliative care to come out to the house for evaluation again.

## 2019-07-25 NOTE — Assessment & Plan Note (Signed)
Daughter Tyler Aas requests to renew palliative care referral. New referral placed.

## 2019-07-27 ENCOUNTER — Other Ambulatory Visit: Payer: Self-pay | Admitting: Family Medicine

## 2019-07-31 ENCOUNTER — Telehealth: Payer: Self-pay | Admitting: Nurse Practitioner

## 2019-07-31 NOTE — Telephone Encounter (Signed)
Spoke with patient's daughter, Erika Weiss, regarding Palliative referral and she was in agreement with this.  She did mention that she was going to speak with her other sister [name] to make sure that it was okay with her - and that if I did not hear back from her everything was fine to proceed with visit.  I have scheduled an In-person visit for 08/01/19 @ 1:30 PM.

## 2019-08-01 ENCOUNTER — Encounter: Payer: Self-pay | Admitting: Nurse Practitioner

## 2019-08-01 ENCOUNTER — Other Ambulatory Visit: Payer: Self-pay

## 2019-08-01 ENCOUNTER — Other Ambulatory Visit: Payer: Medicare Other | Admitting: Nurse Practitioner

## 2019-08-01 DIAGNOSIS — F0391 Unspecified dementia with behavioral disturbance: Secondary | ICD-10-CM

## 2019-08-01 DIAGNOSIS — Z515 Encounter for palliative care: Secondary | ICD-10-CM

## 2019-08-01 NOTE — Progress Notes (Signed)
[place] Consult Note Telephone: [phone]  Fax: [phone]  PATIENT NAME: Erika Weiss DOB: Jan 13, 1925 MRN: [id number]  PRIMARY CARE PROVIDER:   Ria Bush, MD  REFERRING PROVIDER:  Ria Bush, MD [address] [place],  [place] [address]  RESPONSIBLE PARTY:   Willene Hatchet daughter [phone]; Earmon Phoenix daughter [phone]  I was asked by Dr Danise Mina to see Mrs. Radovich for Palliative care consult for goals of care; this visit was made in Mrs. Bastedo home face to face  RECOMMENDATIONS and PLAN:  1. ACP: Full code; discussed at length, left blank MOST form and Hard Choice book to review and will revisit at next [facility] visit. Living will documented and discussed wishes are not to have aggressive interventions to prolong life in case of terminal disease process.    2. Palliative care encounter; Palliative medicine team will continue to support patient, patient's family, and medical team. Visit consisted of counseling and education dealing with the complex and emotionally intense issues of symptom management and palliative care in the setting of serious and potentially life-threatening illness  I spent 90 minutes providing this consultation,  from 1:30pm to 3:00pm. More than 50% of the time in this consultation was spent coordinating communication.   HISTORY OF PRESENT ILLNESS:  Erika R Hellinger is a 86 y.o. year old female with multiple medical problems including Dementia, History of colon cancer, heart murmur, hypertension OA, hyperlipidemia, varicose veins, vein surgery, in lingua no hernia repair, eye surgery, abdominal hysterectomy, closed left radial fracture. Seen by 04/2018 by Dr Garlon Hatchet for left hand injury. Palliative care consult today for goals of care. In-person visit made with Mrs. Christine, Morton and Peter Congo daughters. We talked about purpose of palliative care. We talked about how Mrs. Barth has been  feeling. We talked about the last time she was independent at home. Currently she does ambulate with a cane. Mrs. Lahm had a fall about a month ago. Peter Congo endorses that she does not use the cane as often as she should. We talked about adl's. A caregiver does come once a week to help with Mrs. Bitner's bath. Neoma Laming endorses they had a person coming more often but it made Mr. Elicker more irritable so they just changed it back down to once a week. Mrs. Voorheis does toilet herself. Mrs. Sayegh feeds herself in appetite has been good no notice weight lost. Mrs. Cutbirth does have mild edema to lower extremities for what she has had for some time. We talked about Life review. Mrs. Bessire has has been married for over 37 years with five grown children. Mrs. Pytel worked multiple jobs including at [employer] and [employer]. We talked about what Mrs. Kuehne's daily routine is like. We talked about sleep for which Mrs Cerney has been sleeping without difficulty. Neoma Laming endorses there have been times where she has had trouble sleeping though with a change in medication has helped. We talked about napping during the day. We talked about chronic disease of dementia what she was diagnosed 4 to 5 years ago. She continues on Aricept and Namenda. We talked about slow progression of dementia though recently has seemed to progressed more rapidly. We talked about challenges with memory loss. We talked about behaviors that she currently is on Ativan, Zyprexa, sertraline. Family endorses there are times when it's more difficult, mostly in the evenings. We talked about medical goals of care including aggressive versus conservative versus comfort care.  We talked about MOST form, blank MOST form and Hard Choice book left for family to review. We talked about code status as she currently is a full code. We talked about this Mrs Suber living will with wishes  not to prolong with aggressive  measures. Malachi Bonds endorses that she will review the living will and family to further discuss. We talked about role of palliative care and plan of care. Mrs Happel was very cooperative, smiling making eye contact in answering questions for visit. We talked about her left hand being bruised. [name] endorses the bruising is much better than what it was. It does appear to be tender. We talked about follow up palliative care visit in 4 weeks if needed or sooner should Mrs. Lapenna decline. [name] endorses she called primary provider office today and requested a palliative care consult for their father, Mr. Meda also. We talked about follow-up appointment for Mrs. Vandagriff in 4 weeks. Mr. Sprankle is not an urgent visit, family wishes are to do both appointments together and for weeks. Therapeutic listening and emotional support provided. Contact information. Questions answered to satisfaction.  07/25/2019 weight 136.7 lb  Palliative Care was asked to help to continue to address goals of care.   CODE STATUS: Full code  PPS: 50% HOSPICE ELIGIBILITY/DIAGNOSIS: TBD  PAST MEDICAL HISTORY:  Past Medical History:  Diagnosis Date  . Finger wound, simple, open, subsequent encounter 01/24/2017  . Heart murmur new 2017?  Marland Kitchen History of colon cancer   . HLD (hyperlipidemia)   . Hyperglycemia   . Hypertension   . Osteoarthritis    knees  . Varicose veins of lower extremities with other complications     SOCIAL HX:  Social History   Tobacco Use  . Smoking status: Former Smoker    Quit date: 04/25/1978    Years since quitting: 41.2  . Smokeless tobacco: Never Used  Substance Use Topics  . Alcohol use: No    Alcohol/week: 0.0 standard drinks    ALLERGIES: No Known Allergies   PERTINENT MEDICATIONS:  Outpatient Encounter Medications as of 08/01/2019  Medication Sig  . amLODipine (NORVASC) 10 MG tablet Take 1 tablet (10 mg total) by mouth daily.  . cyanocobalamin 500 MCG tablet Take 500 mcg by mouth  daily.  Marland Kitchen docusate sodium (COLACE) 100 MG capsule Take 200 mg by mouth daily.  Marland Kitchen donepezil (ARICEPT) 10 MG tablet TAKE 1 TABLET BY MOUTH AT BEDTIME *NEW DOSE*  . LORazepam (ATIVAN) 0.5 MG tablet TAKE 0.5 TO 1 TABLET BY MOUTH (0.25-0.5 MG TOTAL) BY MOUTH TWICE DAILIY AS NEEDED FOR ANXIETY  . magnesium hydroxide (MILK OF MAGNESIA) 400 MG/5ML suspension Take by mouth as needed for mild constipation.  . Melatonin 5 MG TABS Take 2 tablets (10 mg total) by mouth at bedtime.  . memantine (NAMENDA) 10 MG tablet TAKE 1 TABLET TWICE A DAY  . metoprolol succinate (TOPROL-XL) 25 MG 24 hr tablet Take 1 tablet (25 mg total) by mouth daily.  . mirtazapine (REMERON) 15 MG tablet Take 0.5 tablets (7.5 mg total) by mouth at bedtime.  . naproxen sodium (ANAPROX) 220 MG tablet Take 220 mg by mouth as needed (pain).   Marland Kitchen OLANZapine (ZYPREXA) 7.5 MG tablet Take 1 tablet (7.5 mg total) by mouth at bedtime.  . sertraline (ZOLOFT) 100 MG tablet TAKE 1 TABLET DAILY   No facility-administered encounter medications on file as of 08/01/2019.    PHYSICAL EXAM:   General: NAD, frail appearing, elderly pleasant female Cardiovascular: regular rate  and rhythm Pulmonary: clear ant fields Extremities: mild BLE edema, no joint deformities Neurological: generalized weakness, walks with cane  Yazmine Sorey Prince [place], NP

## 2019-08-02 ENCOUNTER — Telehealth: Payer: Self-pay | Admitting: Family Medicine

## 2019-08-02 MED ORDER — OLANZAPINE 7.5 MG PO TABS: 7.5000 mg | ORAL_TABLET | Freq: Every day | ORAL | 0 refills | Status: DC

## 2019-08-02 NOTE — Telephone Encounter (Signed)
plz notify olanzapine sent to pharmacy.

## 2019-08-02 NOTE — Telephone Encounter (Signed)
Left detailed message on voicemail. DPR 

## 2019-08-02 NOTE — Addendum Note (Signed)
Addended by: Eustaquio Boyden on: 08/02/2019 04:29 PM   Modules accepted: Orders

## 2019-08-02 NOTE — Telephone Encounter (Signed)
Patient's daughter,Marion,called. Patient has 1 pill left of Olanzapine.  Marion sent a refill request to mail order, but she won't have it in time.  Marion's requesting 15 pills sent to [facility].

## 2019-08-09 ENCOUNTER — Telehealth: Payer: Self-pay | Admitting: Family Medicine

## 2019-08-09 NOTE — Telephone Encounter (Signed)
Patient's Daughter Tyler Aas called today She would like to know if you are able to put in an order for Respite care. If not they would like to know how they could go about getting care for the patient. She stated it is getting hard for her sister and her to take care of the patient

## 2019-08-12 MED ORDER — OLANZAPINE 7.5 MG PO TABS: 7.5000 mg | ORAL_TABLET | Freq: Every day | ORAL | 0 refills | Status: DC

## 2019-08-12 MED ORDER — OLANZAPINE 7.5 MG PO TABS: 7.5000 mg | ORAL_TABLET | Freq: Every day | ORAL | 1 refills | Status: AC

## 2019-08-12 NOTE — Telephone Encounter (Signed)
Olanzapine sent to [facility] as well as short supply to local pharmacy.

## 2019-08-12 NOTE — Telephone Encounter (Signed)
Marion's going out of town this week and is requesting the rx be sent as soon as possible.

## 2019-08-12 NOTE — Addendum Note (Signed)
Addended by: Eustaquio Boyden on: 08/12/2019 02:54 PM   Modules accepted: Orders

## 2019-08-12 NOTE — Telephone Encounter (Signed)
Patient's daughter Erika Weiss called.  Erika Weiss said the mail order never received a new prescription from Dr.G for Olanzapine.  Patient needs a new rx for medication sent to [facility] and a 30 day prescription sent to [facility].

## 2019-08-14 ENCOUNTER — Other Ambulatory Visit: Payer: Self-pay | Admitting: Family Medicine

## 2019-08-14 ENCOUNTER — Telehealth: Payer: Self-pay | Admitting: Family Medicine

## 2019-08-14 ENCOUNTER — Telehealth: Payer: Self-pay | Admitting: Nurse Practitioner

## 2019-08-14 NOTE — Telephone Encounter (Signed)
I would suggest they contact palliative care regarding respite options - this can entail admission to assisted living facility for a short period. If so desired, I can fill out FL2 forms if needed. 

## 2019-08-14 NOTE — Telephone Encounter (Signed)
Patient's daughter called today She stated she spoke with [facility] pharmacy and they did not receive the RX for Olanzapine .

## 2019-08-14 NOTE — Telephone Encounter (Signed)
Name of Medication: Lorazepam Name of Pharmacy: [facility] Last Fill or Written Date and Quantity: 07/15/19, #40 Last Office Visit and Type: 07/25/19, acute wrist injury Next Office Visit and Type: 11/21/19, 6 mo f/u Last Controlled Substance Agreement Date: none Last UDS: none

## 2019-08-14 NOTE — Telephone Encounter (Signed)
Spoke with [facility] asking about 30-day rx sent in.  Says they received it on 08/12/19, however, ins will not cover b/c rx also sent to [facility].  Says out-of-pocket cost is $9.00.    Spoke with pt's daughter, [name] (on dpr), notifying her of above message.  Verbalizes understanding and will pick up med.

## 2019-08-14 NOTE — Telephone Encounter (Signed)
Spoke with pt's daughter, [name] (on dpr), relaying Dr. G's message.  Verbalizes understanding.  

## 2019-08-14 NOTE — Telephone Encounter (Signed)
error 

## 2019-08-14 NOTE — Telephone Encounter (Signed)
Rec'd call from patient's daughter Tyler Aas, requesting information about Respite services for her parents.  I told daughter that I would check into this and have someone call her back and she was in agreement with this.

## 2019-08-18 NOTE — Telephone Encounter (Signed)
ERx 

## 2019-08-27 ENCOUNTER — Encounter: Payer: Self-pay | Admitting: Nurse Practitioner

## 2019-08-27 ENCOUNTER — Other Ambulatory Visit: Payer: Medicare Other | Admitting: Nurse Practitioner

## 2019-08-27 ENCOUNTER — Other Ambulatory Visit: Payer: Self-pay

## 2019-08-27 DIAGNOSIS — F0391 Unspecified dementia with behavioral disturbance: Secondary | ICD-10-CM

## 2019-08-27 DIAGNOSIS — Z515 Encounter for palliative care: Secondary | ICD-10-CM

## 2019-08-27 NOTE — Progress Notes (Signed)
Therapist, nutritional Palliative Care Consult Note Telephone: [phone]  Fax: [phone]  PATIENT NAME: Erika Weiss DOB: 04-May-1924 MRN: [id number]  PRIMARY CARE PROVIDER:   Eustaquio Boyden, MD  REFERRING PROVIDER:  Eustaquio Boyden, MD [address] [place],  [place] [address]  RESPONSIBLE PARTY:   Lorenda Ishihara daughter [phone]; Orlean Patten daughter [phone]  RECOMMENDATIONS and PLAN: 1.ACP: Full code; discussed at length, left blank MOST form and Hard Choice book to review and will revisit at next [facility] visit. Living will documented and discussed wishes are not to have aggressive interventions to prolong life in case of terminal disease process.   2.Palliative care encounter; Palliative medicine team will continue to support patient, patient's family, and medical team. Visit consisted of counseling and education dealing with the complex and emotionally intense issues of symptom management and palliative care in the setting of serious and potentially life-threatening illness  I spent 60 minutes providing this consultation,  from 2:30pm to 3:30pm. More than 50% of the time in this consultation was spent coordinating communication.   HISTORY OF PRESENT ILLNESS:  Erika Weiss is a 86 y.o. year old female with multiple medical problems including Dementia, History of colon cancer, heart murmur, hypertension OA, hyperlipidemia, varicose veins, vein surgery, in lingua no hernia repair, eye surgery, abdominal hysterectomy, closed left radial fracture. Scheduled in person palliative care follow-up visit with Mrs Olds and her daughter [name], husband Mr. Nauman. We talked about purpose of palliative care follow-up visit. [name] in agreement. We talked about how Mrs Betty husband feeling. [name] endorses that she seems to be slowly declining. Ms. Andes seems like her walking has worsened. Mrs Narine does walk more hunched over. Deboah,  daughter endorses she brought her walker from a stressful to try as the cane it seemed not as stable to support her. We talked about symptoms, appetite. [name] endorses Mrs. Balin appetite has been very good no noted weight loss. No recent wound, infections, hospitalizations, falls. We talked about chronic disease progression of dementia. We talked about what to look for with decline. We talked about Hospice benefit under Medicare program and eligibility criteria which at this time Mrs Griego does not meet. We talked about medical goals of care. We talked about challenges with private sitters as Mrs Artley continues to live with her husband who also has dementia with behaviors. [name] endorses that it is difficult to get outside the family caregivers as Mr. Cowper does become irritable. We talked about caregiver fatigue. We talked about family dynamics. We talked about her stuffed dog that barks and response when she talks. Mrs Manfre was able to answer questions. Mrs. Westra did have some trouble with recalling memory. We talked about importance of mobility come and getting exercise. We talked about the option of seeing to restart therapy for strengthening. [name] in agreement. We talked about quality of life. We talked about code status in MOST form is Mrs Alen currently has a full code. [name] endorses family have blank MOST forms the reviewing and will revisit after they look at Living will. We talked about role of palliative care and plan of care. We talked about next scheduled appointment in 6 weeks if needed or sooner should she declined. [name] in agreement. We talked about cognitive changes. Mrs Razavi was very cooperative, smiling and interactive during palliative care visit. Blood pressure 132 / 78. Appointment scheduled. Therapeutic listening,  emotional support provided carry contact information. Questions answered to satisfaction.  Palliative  Care was asked to help to continue to  address goals of care.   CODE STATUS: Full code  PPS: 40% HOSPICE ELIGIBILITY/DIAGNOSIS: TBD  PAST MEDICAL HISTORY:  Past Medical History:  Diagnosis Date  . Finger wound, simple, open, subsequent encounter 01/24/2017  . Heart murmur new 2017?  Marland Kitchen History of colon cancer   . HLD (hyperlipidemia)   . Hyperglycemia   . Hypertension   . Osteoarthritis    knees  . Varicose veins of lower extremities with other complications     SOCIAL HX:  Social History   Tobacco Use  . Smoking status: Former Smoker    Quit date: 04/25/1978    Years since quitting: 41.3  . Smokeless tobacco: Never Used  Substance Use Topics  . Alcohol use: No    Alcohol/week: 0.0 standard drinks    ALLERGIES: No Known Allergies   PERTINENT MEDICATIONS:  Outpatient Encounter Medications as of 08/27/2019  Medication Sig  . amLODipine (NORVASC) 10 MG tablet Take 1 tablet (10 mg total) by mouth daily.  . cyanocobalamin 500 MCG tablet Take 500 mcg by mouth daily.  Marland Kitchen docusate sodium (COLACE) 100 MG capsule Take 200 mg by mouth daily.  Marland Kitchen donepezil (ARICEPT) 10 MG tablet TAKE 1 TABLET BY MOUTH AT BEDTIME *NEW DOSE*  . LORazepam (ATIVAN) 0.5 MG tablet TAKE 0.5 TO 1 TABLET (0.25-0.5MG  TOTAL) BY MOUTH TWICE DAILY AS NEEDED FOR ANXIETY  . magnesium hydroxide (MILK OF MAGNESIA) 400 MG/5ML suspension Take by mouth as needed for mild constipation.  . Melatonin 5 MG TABS Take 2 tablets (10 mg total) by mouth at bedtime.  . memantine (NAMENDA) 10 MG tablet TAKE 1 TABLET TWICE A DAY  . metoprolol succinate (TOPROL-XL) 25 MG 24 hr tablet Take 1 tablet (25 mg total) by mouth daily.  . mirtazapine (REMERON) 15 MG tablet Take 0.5 tablets (7.5 mg total) by mouth at bedtime.  . naproxen sodium (ANAPROX) 220 MG tablet Take 220 mg by mouth as needed (pain).   Marland Kitchen OLANZapine (ZYPREXA) 7.5 MG tablet Take 1 tablet (7.5 mg total) by mouth at bedtime.  . sertraline (ZOLOFT) 100 MG tablet TAKE 1 TABLET DAILY   No facility-administered  encounter medications on file as of 08/27/2019.    PHYSICAL EXAM:   General: NAD, frail appearing, thin pleasant elderly female Cardiovascular: regular rate and rhythm Pulmonary: clear ant fields Extremities: no edema, no joint deformities Neurological: unsteady gain, walks with walker  Kayleeann Huxford Ihor Gully, NP

## 2019-08-28 ENCOUNTER — Telehealth: Payer: Self-pay | Admitting: *Deleted

## 2019-08-28 DIAGNOSIS — W19XXXS Unspecified fall, sequela: Secondary | ICD-10-CM

## 2019-08-28 DIAGNOSIS — F05 Delirium due to known physiological condition: Secondary | ICD-10-CM

## 2019-08-28 DIAGNOSIS — F0391 Unspecified dementia with behavioral disturbance: Secondary | ICD-10-CM

## 2019-08-28 DIAGNOSIS — Z515 Encounter for palliative care: Secondary | ICD-10-CM

## 2019-08-28 DIAGNOSIS — R2689 Other abnormalities of gait and mobility: Secondary | ICD-10-CM

## 2019-08-28 NOTE — Telephone Encounter (Signed)
Clarified with Regina. HH referral placed.  

## 2019-08-28 NOTE — Telephone Encounter (Signed)
plz clarify - would this be home health PT? Is pt willing to have someone come out to the house to work with?

## 2019-08-28 NOTE — Telephone Encounter (Signed)
Christin Guslter NP with Palliative Care called stating that she had a talk with the patient's daughter and they would like an order for physical therapy.. The patient's daughter thinks that it may be beneficial for her mom.

## 2019-09-04 ENCOUNTER — Other Ambulatory Visit: Payer: Self-pay

## 2019-09-04 MED ORDER — AMLODIPINE BESYLATE 10 MG PO TABS: 10.0000 mg | ORAL_TABLET | Freq: Every day | ORAL | 2 refills | Status: AC

## 2019-09-04 NOTE — Telephone Encounter (Signed)
E-scribed refill 

## 2019-09-14 ENCOUNTER — Other Ambulatory Visit: Payer: Self-pay | Admitting: Family Medicine

## 2019-09-16 NOTE — Telephone Encounter (Signed)
Aricept Last filled:  06/10/19, #90 Last OV:  07/25/19, acute hand injury Next OV:  11/21/19, 6 mo f/u

## 2019-09-24 ENCOUNTER — Other Ambulatory Visit: Payer: Self-pay

## 2019-09-24 MED ORDER — LORAZEPAM 0.5 MG PO TABS: ORAL_TABLET | ORAL | 1 refills | Status: DC

## 2019-09-24 NOTE — Telephone Encounter (Signed)
Patient contacted the office and states she needs a refill on Lorazepam. This was last refilled on 08/18/19 for #40 with 0 refills.  Patient was last seen on 07/25/19 and has an upcoming appt on 11/21/19. Ok to refill?

## 2019-09-24 NOTE — Telephone Encounter (Signed)
ERx 

## 2019-10-04 ENCOUNTER — Other Ambulatory Visit: Payer: Self-pay

## 2019-10-04 ENCOUNTER — Telehealth: Payer: Self-pay

## 2019-10-04 ENCOUNTER — Ambulatory Visit (INDEPENDENT_AMBULATORY_CARE_PROVIDER_SITE_OTHER): Payer: Medicare Other | Admitting: Family Medicine

## 2019-10-04 ENCOUNTER — Encounter: Payer: Self-pay | Admitting: Family Medicine

## 2019-10-04 VITALS — BP 152/86 | HR 82 | Temp 98.6°F | Ht 62.0 in | Wt 145.5 lb

## 2019-10-04 DIAGNOSIS — R609 Edema, unspecified: Secondary | ICD-10-CM | POA: Insufficient documentation

## 2019-10-04 DIAGNOSIS — R8290 Unspecified abnormal findings in urine: Secondary | ICD-10-CM | POA: Diagnosis not present

## 2019-10-04 LAB — POC URINALSYSI DIPSTICK (AUTOMATED)
Bilirubin, UA: NEGATIVE
Glucose, UA: NEGATIVE
Ketones, UA: NEGATIVE
Nitrite, UA: POSITIVE
Protein, UA: POSITIVE — AB
Spec Grav, UA: >=1.03 — AB (ref 1.010–1.025)
Urobilinogen, UA: 0.2 E.U./dL
pH, UA: 6 (ref 5.0–8.0)

## 2019-10-04 LAB — POCT UA - MICROSCOPIC ONLY

## 2019-10-04 NOTE — Assessment & Plan Note (Signed)
Significant difficulty getting urine sample.. very concentrated.  Appears contaminated.  Minimally symptomatic. Increase water intake.

## 2019-10-04 NOTE — Telephone Encounter (Signed)
Erika Weiss (DPR signed )called; Erika Weiss said on 10/03/19 noticed very strong odor and darker color of urine; Both ankles are swollen and pt having difficulty walking due to swollen ankles and stiffiness. No redness and doesn't think pt having any pain. Pt cannot say if pain or burning. Pt voiding more frequent. No blood seen. Pt has been disorientated by saying pt wants to go home and this is an everyday occurrence. Erika Weiss cannot say that confusion is worse. Pt has had a runny nose. Pt has no covid symptoms, no travel and no known exposure to + covid.  appt scheduled for today at 4 PM with Dr Ermalene Searing. Will bring card to show had both pfizer covid vaccine.

## 2019-10-04 NOTE — Progress Notes (Signed)
Chief Complaint  Patient presents with  . Joint Swelling    Ankles  . Dark Urine    with odor (unable to give urine sample)    History of Present Illness: HPI    86 year old female with dementia, htn  and varicose veins presents with her given new onset dark  Urine and new foul odor of urine. Onging in last 1-2 days. She is always incontinent, wear depends.  No confusion.  No dysuria. No abdominal pain.  She is having swelling in ankles. Stiffness in ankle, not to painful.  BP at home per daughter < 140/90  Good appetite.. has been eating more. 9 lb weight gain in last 2 months, gradually. No change in breathing. No chest pain..   BP Readings from Last 3 Encounters:  10/04/19 (!) 152/86  07/25/19 (!) 150/80  05/23/19 124/66   Wt Readings from Last 3 Encounters:  10/04/19 145 lb 8 oz (66 kg)  07/25/19 136 lb 7 oz (61.9 kg)  05/23/19 134 lb 4.8 oz (60.9 kg)       This visit occurred during the SARS-CoV-2 public health emergency.  Safety protocols were in place, including screening questions prior to the visit, additional usage of staff PPE, and extensive cleaning of exam room while observing appropriate contact time as indicated for disinfecting solutions.   COVID 19 screen:  No recent travel or known exposure to COVID19 The patient denies respiratory symptoms of COVID 19 at this time. The importance of social distancing was discussed today.     Review of Systems  Constitutional: Negative for chills and fever.  HENT: Negative for congestion and ear pain.   Eyes: Negative for pain and redness.  Respiratory: Negative for cough and shortness of breath.   Cardiovascular: Negative for chest pain, palpitations and leg swelling.  Gastrointestinal: Negative for abdominal pain, blood in stool, constipation, diarrhea, nausea and vomiting.  Genitourinary: Negative for dysuria.  Musculoskeletal: Negative for falls and myalgias.  Skin: Negative for rash.  Neurological:  Negative for dizziness.  Psychiatric/Behavioral: Negative for depression. The patient is not nervous/anxious.       Past Medical History:  Diagnosis Date  . Finger wound, simple, open, subsequent encounter 01/24/2017  . Heart murmur new 2017?  Marland Kitchen History of colon cancer   . HLD (hyperlipidemia)   . Hyperglycemia   . Hypertension   . Osteoarthritis    knees  . Varicose veins of lower extremities with other complications     reports that she quit smoking about 41 years ago. She has never used smokeless tobacco. She reports that she does not drink alcohol and does not use drugs.   Current Outpatient Medications:  .  amLODipine (NORVASC) 10 MG tablet, Take 1 tablet (10 mg total) by mouth daily., Disp: 90 tablet, Rfl: 2 .  cyanocobalamin 500 MCG tablet, Take 500 mcg by mouth daily., Disp: , Rfl:  .  docusate sodium (COLACE) 100 MG capsule, Take 200 mg by mouth daily., Disp: , Rfl:  .  donepezil (ARICEPT) 10 MG tablet, TAKE 1 TABLET AT BEDTIME, Disp: 90 tablet, Rfl: 1 .  LORazepam (ATIVAN) 0.5 MG tablet, TAKE 0.5 TO 1 TABLET (0.25-0.5MG  TOTAL) BY MOUTH TWICE DAILY AS NEEDED FOR ANXIETY, Disp: 40 tablet, Rfl: 1 .  magnesium hydroxide (MILK OF MAGNESIA) 400 MG/5ML suspension, Take by mouth as needed for mild constipation., Disp: , Rfl:  .  Melatonin 5 MG TABS, Take 2 tablets (10 mg total) by mouth at bedtime., Disp: ,  Rfl: 0 .  memantine (NAMENDA) 10 MG tablet, TAKE 1 TABLET TWICE A DAY, Disp: 180 tablet, Rfl: 3 .  metoprolol succinate (TOPROL-XL) 25 MG 24 hr tablet, Take 1 tablet (25 mg total) by mouth daily., Disp: 90 tablet, Rfl: 3 .  mirtazapine (REMERON) 15 MG tablet, Take 0.5 tablets (7.5 mg total) by mouth at bedtime., Disp: 30 tablet, Rfl: 0 .  naproxen sodium (ANAPROX) 220 MG tablet, Take 220 mg by mouth as needed (pain). , Disp: , Rfl:  .  OLANZapine (ZYPREXA) 7.5 MG tablet, Take 1 tablet (7.5 mg total) by mouth at bedtime., Disp: 90 tablet, Rfl: 1 .  sertraline (ZOLOFT) 100 MG tablet,  TAKE 1 TABLET DAILY, Disp: 90 tablet, Rfl: 2   Observations/Objective: Blood pressure (!) 152/86, pulse 82, temperature 98.6 F (37 C), temperature source Temporal, height 5\' 2"  (1.575 m), weight 145 lb 8 oz (66 kg), SpO2 95 %.  Physical Exam Constitutional:      General: She is not in acute distress.    Appearance: Normal appearance. She is well-developed. She is not ill-appearing or toxic-appearing.     Comments: Elderly female in [place]:     Head: Normocephalic.     Right Ear: Hearing, tympanic membrane, ear canal and external ear normal. Tympanic membrane is not erythematous, retracted or bulging.     Left Ear: Hearing, tympanic membrane, ear canal and external ear normal. Tympanic membrane is not erythematous, retracted or bulging.     Nose: No mucosal edema or rhinorrhea.     Right Sinus: No maxillary sinus tenderness or frontal sinus tenderness.     Left Sinus: No maxillary sinus tenderness or frontal sinus tenderness.     Mouth/Throat:     Pharynx: Uvula midline.  Eyes:     General: Lids are normal. Lids are everted, no foreign bodies appreciated.     Conjunctiva/sclera: Conjunctivae normal.     Pupils: Pupils are equal, round, and reactive to light.  Neck:     Thyroid: No thyroid mass or thyromegaly.     Vascular: No carotid bruit.     Trachea: Trachea normal.  Cardiovascular:     Rate and Rhythm: Normal rate and regular rhythm.     Pulses: Normal pulses.     Heart sounds: Normal heart sounds, S1 normal and S2 normal. No murmur heard.  No friction rub. No gallop.   Pulmonary:     Effort: Pulmonary effort is normal. No tachypnea or respiratory distress.     Breath sounds: Normal breath sounds. No decreased breath sounds, wheezing, rhonchi or rales.  Abdominal:     General: Bowel sounds are normal.     Palpations: Abdomen is soft.     Tenderness: There is no abdominal tenderness. There is no right CVA tenderness or left CVA tenderness.  Musculoskeletal:     Cervical  back: Normal range of motion and neck supple.     Right lower leg: Edema present.     Left lower leg: Edema present.  Skin:    General: Skin is warm and dry.     Findings: No rash.  Neurological:     Mental Status: She is alert.  Psychiatric:        Mood and Affect: Mood is not anxious or depressed.        Speech: Speech normal.        Behavior: Behavior normal. Behavior is cooperative.        Thought Content: Thought content normal.  Cognition and Memory: Cognition is impaired. Memory is impaired.        Judgment: Judgment normal.     Comments: At baseline    Bilateral varicose veins  Assessment and Plan      Kerby Nora, MD

## 2019-10-04 NOTE — Assessment & Plan Note (Signed)
Likely multifactorial and chronic.   She has had some weight gain but sounds gradually ( may be due to Remeron).  No clear sign of pulmonary edema.  Recommend elevation of legs and compression hose.  Follow up with PCP.

## 2019-10-04 NOTE — Telephone Encounter (Addendum)
Erika Weiss pts daughter (DPR signed) left v/m and she thinks pt may have UTI and request cb to pick up urine container to have urine cked today. Left v/m requesting cb. I spoke with marian walker and she is not aware of pt having problems and [name] will try to have Erika Weiss or [name] call the office ASAP since it is Fri afternoon.

## 2019-10-04 NOTE — Patient Instructions (Addendum)
Elevate legs above heart as much as able when sitting. Try wearing compression hose. Increase water intake. No suggestion of urinary tract infection. Likely odor from inadequate fluid intake. If swelling not improving consider trial off amlodipine.. discuss first with Dr.G.

## 2019-10-15 ENCOUNTER — Other Ambulatory Visit: Payer: Self-pay

## 2019-10-15 ENCOUNTER — Other Ambulatory Visit: Payer: Medicare Other | Admitting: Nurse Practitioner

## 2019-10-15 ENCOUNTER — Encounter: Payer: Self-pay | Admitting: Nurse Practitioner

## 2019-10-15 DIAGNOSIS — Z515 Encounter for palliative care: Secondary | ICD-10-CM

## 2019-10-15 DIAGNOSIS — F0391 Unspecified dementia with behavioral disturbance: Secondary | ICD-10-CM

## 2019-10-15 NOTE — Progress Notes (Addendum)
Therapist, nutritional Palliative Care Consult Note Telephone: [phone]  Fax: [phone]  PATIENT NAME: Erika Weiss DOB: 27-Mar-1925 MRN: [id number]  PRIMARY CARE PROVIDER:   Eustaquio Boyden, MD  REFERRING PROVIDER:  Eustaquio Boyden, MD [address] [place],  [place] [address] RESPONSIBLE PARTY:Gayle Dan Humphreys daughter [phone]; Orlean Patten daughter [phone]  RECOMMENDATIONS and PLAN: 1.NLG:XQJJ code; discussed at length, left blank MOST form and [place] book to review and will revisit at next [facility] visit. Living will documented and discussed wishes are not to have aggressive interventions to prolong life in case of terminal disease process.  2.Palliative care encounter; Palliative medicine team will continue to support patient, patient's family, and medical team. Visit consisted of counseling and education dealing with the complex and emotionally intense issues of symptom management and palliative care in the setting of serious and potentially life-threatening illness  I spent 60 minutes providing this consultation,  Start at 1:00pm. More than 50% of the time in this consultation was spent coordinating communication.   HISTORY OF PRESENT ILLNESS:  Erika R Cottrill is a 86 y.o. year old female with multiple medical problems including Dementia, History of colon cancer, heart murmur, hypertension OA, hyperlipidemia, varicose veins, vein surgery, in lingua no hernia repair, eye surgery, abdominal hysterectomy, closed left radial fracture. Palliative Care was asked to help to continue to address goals of care. Face-to-face in-person visit with Mrs Jashay, Roddy daughter Health Care power of attorney and Mr. Dady. We talked about purpose of how to care visit. [name] in agreement. We talked about how Mrs Hedlund has been doing. She has had a recent appointment with Dr Sharen Hones congestion which turned out to be allergies and has improved.  We talked about appetite which has been good no no no weight loss or changing clothes size. We talked about no recent files, hospitalizations come infections. [name] endorses they are going to have physical therapy come for strengthening and balance for Mrs Perkins. Praise Mrs Hughley for agreeing to physical therapy as it will be very beneficial. We talked about cognition appears to be stable no further changes. Mrs Rhue does continue to be cooperative easily redirectable. Mr. Anzaldo does sleep most nights though there are some nights where she is up. We review current medication list for what she currently is on Seroquel, Trazodone and Melatonin at bedtime. We talked about sleep hygiene and pattern. We talked about challenges with chronic disease progression of dementia. We talked about medical goals of care. We talked about most form. [name] endorses they did further discuss among family. We reviewed most form. [name] does share Health Care Power-of-Attorney with her sister Dondra Spry. [name] endorses that she will review with Dondra Spry. [name] endorses she has reviewed Mrs. Gjerde's living well,  will go by with what their wishes were prior to diagnosis of dementia. We talked about after she and Dondra Spry review the form when they are ready to complete to let me or Dr Sharen Hones and will sign in as provider. Debra in agreement. We talked about so changes have been happening with Mrs Groner such as she is sleeping more no appetite does remain stable with no new weight loss. We talked about role of palliative care and plan of care. We talked about follow up palliative care visit in two months if needed or sooner should she declined. [name] in agreement. Therapeutic listening and emotional support provided. Contact information. Questions answer the satisfaction.  130/70  CODE STATUS: full code  PPS: 40%  HOSPICE ELIGIBILITY/DIAGNOSIS: TBD  PAST MEDICAL HISTORY:  Past Medical History:  Diagnosis Date  . Finger  wound, simple, open, subsequent encounter 01/24/2017  . Heart murmur new 2017?  Marland Kitchen History of colon cancer   . HLD (hyperlipidemia)   . Hyperglycemia   . Hypertension   . Osteoarthritis    knees  . Varicose veins of lower extremities with other complications     SOCIAL HX:  Social History   Tobacco Use  . Smoking status: Former Smoker    Quit date: 04/25/1978    Years since quitting: 41.5  . Smokeless tobacco: Never Used  Substance Use Topics  . Alcohol use: No    Alcohol/week: 0.0 standard drinks    ALLERGIES: No Known Allergies   PERTINENT MEDICATIONS:  Outpatient Encounter Medications as of 10/15/2019  Medication Sig  . amLODipine (NORVASC) 10 MG tablet Take 1 tablet (10 mg total) by mouth daily.  . cyanocobalamin 500 MCG tablet Take 500 mcg by mouth daily.  Marland Kitchen docusate sodium (COLACE) 100 MG capsule Take 200 mg by mouth daily.  Marland Kitchen donepezil (ARICEPT) 10 MG tablet TAKE 1 TABLET AT BEDTIME  . LORazepam (ATIVAN) 0.5 MG tablet TAKE 0.5 TO 1 TABLET (0.25-0.5MG  TOTAL) BY MOUTH TWICE DAILY AS NEEDED FOR ANXIETY  . magnesium hydroxide (MILK OF MAGNESIA) 400 MG/5ML suspension Take by mouth as needed for mild constipation.  . Melatonin 5 MG TABS Take 2 tablets (10 mg total) by mouth at bedtime.  . memantine (NAMENDA) 10 MG tablet TAKE 1 TABLET TWICE A DAY  . metoprolol succinate (TOPROL-XL) 25 MG 24 hr tablet Take 1 tablet (25 mg total) by mouth daily.  . mirtazapine (REMERON) 15 MG tablet Take 0.5 tablets (7.5 mg total) by mouth at bedtime.  . naproxen sodium (ANAPROX) 220 MG tablet Take 220 mg by mouth as needed (pain).   Marland Kitchen OLANZapine (ZYPREXA) 7.5 MG tablet Take 1 tablet (7.5 mg total) by mouth at bedtime.  . sertraline (ZOLOFT) 100 MG tablet TAKE 1 TABLET DAILY   No facility-administered encounter medications on file as of 10/15/2019.    PHYSICAL EXAM:   General: NAD, frail appearing, thin pleasant female Cardiovascular: regular rate and rhythm Pulmonary: clear ant  fields Neuro: generalized weakness Jastin Fore Ihor Gully, NP

## 2019-10-23 NOTE — Telephone Encounter (Signed)
Pt had appt 10/04/19.

## 2019-11-15 ENCOUNTER — Telehealth: Payer: Self-pay

## 2019-11-15 NOTE — Telephone Encounter (Signed)
Pt needs a new referral for home PT for arthritis pain. The last referral expired. Requires a new one. Please advise daughter if this can be done. 

## 2019-11-15 NOTE — Telephone Encounter (Signed)
Last seen 07/25/2019. Wuold need office visit as needs to be seen within 90 days to start [facility].

## 2019-11-19 ENCOUNTER — Telehealth: Payer: Self-pay | Admitting: *Deleted

## 2019-11-19 NOTE — Telephone Encounter (Addendum)
Patient's daughter [name] (on [place]) called wanting to know what type of appointment her mom was having tomorrow. [name] wanted to know if it is for a physical. Quay Burow that the appointment note shows it is for a 6 month follow-up. [name] wanted to know when her mom had her last lab work done and she was advised in January. [name] wanted to know if her mom would need lab work and she was advised that Dr. Sharen Hones will determine that when he sees her for the appointment.  Quay Burow that her mom's appointment is Thursday and not tomorrow and she said that she knew that but she knew she had said the wrong day.

## 2019-11-19 NOTE — Telephone Encounter (Signed)
Noted  

## 2019-11-20 ENCOUNTER — Telehealth: Payer: Self-pay | Admitting: Family Medicine

## 2019-11-20 MED ORDER — MIRTAZAPINE 15 MG PO TABS: 7.5000 mg | ORAL_TABLET | Freq: Every day | ORAL | 1 refills | Status: DC

## 2019-11-20 NOTE — Telephone Encounter (Signed)
Medication Refill - Medication:  mirtazapine (REMERON) 15 MG tablet  Has the patient contacted their pharmacy? Yes advised to call office. Patient requested medication but has not heard anything. No record of request.    Preferred Pharmacy (with phone number or street name):  [facility] [place], [place] - [address] AT [facility] Phone:  [phone]  Fax:  [phone]

## 2019-11-20 NOTE — Telephone Encounter (Signed)
Refilled

## 2019-11-21 ENCOUNTER — Ambulatory Visit: Payer: Medicare Other | Admitting: Family Medicine

## 2019-11-21 ENCOUNTER — Telehealth: Payer: Self-pay | Admitting: Nurse Practitioner

## 2019-11-21 NOTE — Telephone Encounter (Signed)
Per scheduled to stop by Ms. Tschetter to sign MOST form completed. [name], daughter endorses she forgot the forms, will bring forms to in person scheduled PC visit in 1 week to complete. [name] in agreement

## 2019-11-22 MED ORDER — MIRTAZAPINE 15 MG PO TABS: 7.5000 mg | ORAL_TABLET | Freq: Every day | ORAL | 0 refills | Status: DC

## 2019-11-22 NOTE — Addendum Note (Signed)
Addended by: Damita Lack on: 11/22/2019 11:39 AM   Modules accepted: Orders

## 2019-11-22 NOTE — Telephone Encounter (Signed)
Refill should have gone to [facility].  Resent to mail order pharmacy and cancelled at [facility].

## 2019-11-25 ENCOUNTER — Ambulatory Visit: Payer: Medicare Other | Admitting: Family Medicine

## 2019-11-26 ENCOUNTER — Other Ambulatory Visit: Payer: Self-pay

## 2019-11-26 ENCOUNTER — Other Ambulatory Visit: Payer: Medicare Other | Admitting: Nurse Practitioner

## 2019-11-26 ENCOUNTER — Encounter: Payer: Self-pay | Admitting: Nurse Practitioner

## 2019-11-26 DIAGNOSIS — F0391 Unspecified dementia with behavioral disturbance: Secondary | ICD-10-CM

## 2019-11-26 DIAGNOSIS — Z515 Encounter for palliative care: Secondary | ICD-10-CM

## 2019-11-26 NOTE — Progress Notes (Signed)
Therapist, nutritional Palliative Care Consult Note Telephone: [phone]  Fax: [phone]  PATIENT NAME: Erika Weiss DOB: 11/10/1924 MRN: [id number]  PRIMARY CARE PROVIDER:   Eustaquio Boyden, MD  REFERRING PROVIDER:  Eustaquio Boyden, MD [address] [place],  [place] [address] RESPONSIBLE PARTY:Erika Weiss daughter [id number]; Erika Weiss daughter [id number]  RECOMMENDATIONS and PLAN: 1.MHD:QQIWLNLGX and completed DNR goldenrod/MOST Form, placed in vynca  2.Palliative care encounter; Palliative medicine team will continue to support patient, patient's family, and medical team. Visit consisted of counseling and education dealing with the complex and emotionally intense issues of symptom management and palliative care in the setting of serious and potentially life-threatening illness  3. F/u visit in 2 months for ongoing monitoring chronic disease progression, decline, unsteady gait, weight  I spent 90 minutes providing this consultation, start at 4:00pm. More than 50% of the time in this consultation was spent coordinating communication.   HISTORY OF PRESENT ILLNESS:  Erika Weiss is a 86 y.o. year old female with multiple medical problems including Dementia, History of colon cancer, heart murmur, hypertension OA, hyperlipidemia, varicose veins, vein surgery, in lingua no hernia repair, eye surgery, abdominal hysterectomy, closed left radial fracture. In-person visit with Erika Weiss, Erika Weiss and daughter Erika Weiss power of attorney shared with her sister. We talked about purpose of palliative care visit. Erika Weiss an agreement. We talked about how Erika Weiss has been doing. Erika. Weiss has been slowly declining. Erika Weiss endorses that her walk has slowed to a shuffle and it is very hard for her to get up out of a couch or chair on her own. Erika Weiss has been moderate assistance getting out of the chair and helping balance as  she attempts to walk with a cane or a walker. Erika Weiss does require to be bathed and dressed as she's having a harder time with adl's. Erika Weiss does have episodes of incontinence with no noted constipation for Erika Weiss. We talked about her appetite which which continues to be good. Erika Weiss endorses Erika. Weiss does prefer sweets. Erika Weiss had a recent birthday last week with lots of cake, cupcakes and family visits, birthday songs. Erika Weiss endorses typically though when she does have a lot of sugar during the day or in the evening it makes it difficult for her to rest at night and she is awake. We talked about sleep hygiene. We talked about daily routine. We talked about slow cognitive changes in the setting of chronic disease and progression. We talked about realistic expectations. We talked about new caregiver that's been staying one day a week which has been very helpful. We talked about caregiver stress and fatigue, self-care. We talked about medical goals of care including aggressive versus comfort care. We talked about MOST form and completed with wishes for limited interventions, DNR, IV fluids and antibiotics for define period of time and no tube feeding. MOST form completed, Goldenrod form completed for DNR, placed in vynca. We talked about appointment with Erika Weiss tomorrow and will request lab since she will be in the office to limited interventions, DNR, IV fluids and antibiotics for define of time and no tube feeding. MOST form completed, Goldenrod form completed for DNR, placed in vynca. We talked about appointment with Erika Weiss tomorrow and will request lab since she will be in the office for complete metabolic panel including albumin I'm a total protein and CBC. Erika Weiss endorses she will go to the appointment and talk to Erika.  Sharen Weiss also. We talked about slow decline in progression. We talked about the changes that are making it more difficult to get Erika Garside to her appointments. We  talked about the option of possibly a [facility] provider for primary care. Erika Weiss endorses she will further discuss with Erika Weiss insight and input. We talked about role of palliative care and plan of care. Discuss that will follow up in two months if needed or sooner should Erika Lewan decline. Erika Weiss in agreement an appointment scheduled. Therapeutic listening and emotional support provided. Contact information. Questions answered to satisfaction.  Palliative Care was asked to help to continue to address goals of care.   CODE STATUS: DNR  PPS: 40% HOSPICE ELIGIBILITY/DIAGNOSIS: TBD  PAST MEDICAL HISTORY:  Past Medical History:  Diagnosis Date  . Finger wound, simple, open, subsequent encounter 01/24/2017  . Heart murmur new 2017?  Marland Kitchen History of colon cancer   . HLD (hyperlipidemia)   . Hyperglycemia   . Hypertension   . Osteoarthritis    knees  . Varicose veins of lower extremities with other complications     SOCIAL HX:  Social History   Tobacco Use  . Smoking status: Former Smoker    Quit date: 04/25/1978    Years since quitting: 41.6  . Smokeless tobacco: Never Used  Substance Use Topics  . Alcohol use: No    Alcohol/week: 0.0 standard drinks    ALLERGIES: No Known Allergies   PERTINENT MEDICATIONS:  Outpatient Encounter Medications as of 11/26/2019  Medication Sig  . amLODipine (NORVASC) 10 MG tablet Take 1 tablet (10 mg total) by mouth daily.  . cyanocobalamin 500 MCG tablet Take 500 mcg by mouth daily.  Marland Kitchen docusate sodium (COLACE) 100 MG capsule Take 200 mg by mouth daily.  Marland Kitchen donepezil (ARICEPT) 10 MG tablet TAKE 1 TABLET AT BEDTIME  . LORazepam (ATIVAN) 0.5 MG tablet TAKE 0.5 TO 1 TABLET (0.25-0.5MG  TOTAL) BY MOUTH TWICE DAILY AS NEEDED FOR ANXIETY  . magnesium hydroxide (MILK OF MAGNESIA) 400 MG/5ML suspension Take by mouth as needed for mild constipation.  . Melatonin 5 MG TABS Take 2 tablets (10 mg total) by mouth at bedtime.  . memantine (NAMENDA) 10 MG  tablet TAKE 1 TABLET TWICE A DAY  . metoprolol succinate (TOPROL-XL) 25 MG 24 hr tablet Take 1 tablet (25 mg total) by mouth daily.  . mirtazapine (REMERON) 15 MG tablet Take 0.5 tablets (7.5 mg total) by mouth at bedtime.  . naproxen sodium (ANAPROX) 220 MG tablet Take 220 mg by mouth as needed (pain).   Marland Kitchen OLANZapine (ZYPREXA) 7.5 MG tablet Take 1 tablet (7.5 mg total) by mouth at bedtime.  . sertraline (ZOLOFT) 100 MG tablet TAKE 1 TABLET DAILY   No facility-administered encounter medications on file as of 11/26/2019.    PHYSICAL EXAM:   General: chronically ill, elderly, pleasant female Cardiovascular: regular rate and rhythm Pulmonary: clear ant fields Extremities: mild BLE edema, no joint deformities Neurological: unsteady gait  Ulmer Degen [place], NP

## 2019-11-27 ENCOUNTER — Other Ambulatory Visit: Payer: Self-pay

## 2019-11-27 ENCOUNTER — Encounter: Payer: Self-pay | Admitting: Family Medicine

## 2019-11-27 ENCOUNTER — Ambulatory Visit (INDEPENDENT_AMBULATORY_CARE_PROVIDER_SITE_OTHER): Payer: Medicare Other | Admitting: Family Medicine

## 2019-11-27 VITALS — BP 170/84 | HR 83 | Temp 97.6°F | Ht 62.0 in | Wt 144.4 lb

## 2019-11-27 DIAGNOSIS — R2681 Unsteadiness on feet: Secondary | ICD-10-CM

## 2019-11-27 DIAGNOSIS — Z515 Encounter for palliative care: Secondary | ICD-10-CM | POA: Diagnosis not present

## 2019-11-27 DIAGNOSIS — F0391 Unspecified dementia with behavioral disturbance: Secondary | ICD-10-CM

## 2019-11-27 DIAGNOSIS — F05 Delirium due to known physiological condition: Secondary | ICD-10-CM

## 2019-11-27 DIAGNOSIS — I10 Essential (primary) hypertension: Secondary | ICD-10-CM | POA: Diagnosis not present

## 2019-11-27 DIAGNOSIS — R454 Irritability and anger: Secondary | ICD-10-CM

## 2019-11-27 DIAGNOSIS — R011 Cardiac murmur, unspecified: Secondary | ICD-10-CM | POA: Diagnosis not present

## 2019-11-27 LAB — CBC WITH DIFFERENTIAL/PLATELET
Basophils Absolute: 0 10*3/uL (ref 0.0–0.1)
Basophils Relative: 0.2 % (ref 0.0–3.0)
Eosinophils Absolute: 0.2 10*3/uL (ref 0.0–0.7)
Eosinophils Relative: 2.4 % (ref 0.0–5.0)
HCT: 40.3 % (ref 36.0–46.0)
Hemoglobin: 13.3 g/dL (ref 12.0–15.0)
Lymphocytes Relative: 27.7 % (ref 12.0–46.0)
Lymphs Abs: 2.4 10*3/uL (ref 0.7–4.0)
MCHC: 32.9 g/dL (ref 30.0–36.0)
MCV: 89.6 fl (ref 78.0–100.0)
Monocytes Absolute: 0.8 10*3/uL (ref 0.1–1.0)
Monocytes Relative: 9.9 % (ref 3.0–12.0)
Neutro Abs: 5.1 10*3/uL (ref 1.4–7.7)
Neutrophils Relative %: 59.8 % (ref 43.0–77.0)
Platelets: 255 10*3/uL (ref 150.0–400.0)
RBC: 4.5 Mil/uL (ref 3.87–5.11)
RDW: 14.7 % (ref 11.5–15.5)
WBC: 8.5 10*3/uL (ref 4.0–10.5)

## 2019-11-27 LAB — COMPREHENSIVE METABOLIC PANEL
ALT: 9 U/L (ref 0–35)
AST: 18 U/L (ref 0–37)
Albumin: 4.1 g/dL (ref 3.5–5.2)
Alkaline Phosphatase: 107 U/L (ref 39–117)
BUN: 25 mg/dL — ABNORMAL HIGH (ref 6–23)
CO2: 26 mEq/L (ref 19–32)
Calcium: 10 mg/dL (ref 8.4–10.5)
Chloride: 103 mEq/L (ref 96–112)
Creatinine, Ser: 1.08 mg/dL (ref 0.40–1.20)
GFR: 57.29 mL/min — ABNORMAL LOW (ref >60.00)
Glucose, Bld: 100 mg/dL — ABNORMAL HIGH (ref 70–99)
Potassium: 4.4 mEq/L (ref 3.5–5.1)
Sodium: 138 mEq/L (ref 135–145)
Total Bilirubin: 0.4 mg/dL (ref 0.2–1.2)
Total Protein: 8.2 g/dL (ref 6.0–8.3)

## 2019-11-27 NOTE — Progress Notes (Signed)
This visit was conducted in person.  BP (!) 170/84 (BP Location: Right Arm, Cuff Size: Normal)   Pulse 83   Temp 97.6 F (36.4 C) (Temporal)   Ht 5\' 2"  (1.575 m)   Wt 144 lb 7 oz (65.5 kg)   SpO2 96%   BMI 26.42 kg/m   BP Readings from Last 3 Encounters:  11/27/19 (!) 170/84  10/04/19 (!) 152/86  07/25/19 (!) 150/80    CC: 6 mo f/u visit  Subjective:    Patient ID: [id number] R Beamer, female    DOB: 09/17/1924, 86 y.o.   MRN: [id number]  HPI: [id number] R Solis is a 86 y.o. female presenting on 11/27/2019 for Follow-up (Here for 6 mo f/u.  Pt accomapanied by daughter, 01/27/2020- temp 97.7 and husband, [name]- temp 97.8.)   Recent birthday! Home palliative care encounter from yesterday reviewed. MOST form completed - limited interventions, DNR, IVF and abx for defined trial. Request labs today - faxed results to [phone] attn Christin Gusler.  New caregiver one day a week - this has been helpful.  Increasing unsteadiness noted without falls. Increased stiffness noted as well.   BP markedly elevated - although yesterday's home BP by palliative care nurse was 130/70. She continues amlodipine 10mg  daily and toprol XL 25mg  daily. They think she did take her BP meds this morning - usually taken every morning.     Normal bowel movements - off any bowel regimen.  Bladder - not urinating too frequently Sleep - some days good, some days struggle.  Continues aricept 10mg  daily, namenda 10mg  twice daily, sertraline 100mg  daily, zyprexa 7.5mg  at night time.      Relevant past medical, surgical, family and social history reviewed and updated as indicated. Interim medical history since our last visit reviewed. Allergies and medications reviewed and updated. Outpatient Medications Prior to Visit  Medication Sig Dispense Refill  . amLODipine (NORVASC) 10 MG tablet Take 1 tablet (10 mg total) by mouth daily. 90 tablet 2  . cyanocobalamin 500 MCG tablet Take 500 mcg by mouth daily.    Mamie Levers  docusate sodium (COLACE) 100 MG capsule Take 200 mg by mouth daily.    donepezil (ARICEPT) 10 MG tablet TAKE 1 TABLET AT BEDTIME 90 tablet 1  . LORazepam (ATIVAN) 0.5 MG tablet TAKE 0.5 TO 1 TABLET (0.25-0.5MG  TOTAL) BY MOUTH TWICE DAILY AS NEEDED FOR ANXIETY 40 tablet 1  . magnesium hydroxide (MILK OF MAGNESIA) 400 MG/5ML suspension Take by mouth as needed for mild constipation.    . Melatonin 5 MG TABS Take 2 tablets (10 mg total) by mouth at bedtime.  0  . memantine (NAMENDA) 10 MG tablet TAKE 1 TABLET TWICE A DAY 180 tablet 3  . metoprolol succinate (TOPROL-XL) 25 MG 24 hr tablet Take 1 tablet (25 mg total) by mouth daily. 90 tablet 3  . mirtazapine (REMERON) 15 MG tablet Take 0.5 tablets (7.5 mg total) by mouth at bedtime. 45 tablet 0  . naproxen sodium (ANAPROX) 220 MG tablet Take 220 mg by mouth as needed (pain).     OLANZapine (ZYPREXA) 7.5 MG tablet Take 1 tablet (7.5 mg total) by mouth at bedtime. 90 tablet 1  . sertraline (ZOLOFT) 100 MG tablet TAKE 1 TABLET DAILY 90 tablet 2   No facility-administered medications prior to visit.     Per HPI unless specifically indicated in ROS section below Review of Systems Objective:  BP (!) 170/84 (BP Location: Right Arm, Cuff Size: Normal)  Pulse 83   Temp 97.6 F (36.4 C) (Temporal)   Ht 5\' 2"  (1.575 m)   Wt 144 lb 7 oz (65.5 kg)   SpO2 96%   BMI 26.42 kg/m   Wt Readings from Last 3 Encounters:  11/27/19 144 lb 7 oz (65.5 kg)  10/04/19 145 lb 8 oz (66 kg)  07/25/19 136 lb 7 oz (61.9 kg)      Physical Exam Vitals and nursing note reviewed.  Constitutional:      Appearance: Normal appearance. She is not ill-appearing.     Comments: Not using any ambulatory assistive device - they left cane in car. Daughter gets cane prior to leaving office.   Cardiovascular:     Rate and Rhythm: Normal rate and regular rhythm.     Pulses: Normal pulses.     Heart sounds: Murmur (3/6 systolic murmur) heard.   Pulmonary:     Effort:  Pulmonary effort is normal. No respiratory distress.     Breath sounds: Normal breath sounds. No wheezing, rhonchi or rales.     Comments: Few crackles bibasilarly Musculoskeletal:     Right lower leg: No edema.     Left lower leg: No edema.  Skin:    General: Skin is warm and dry.     Findings: No rash.  Neurological:     Mental Status: She is alert.     Comments:  CN grossly intact Increased stiffness noted, BUE with cogwheel rigidity on exam Unsteady shuffling gait  Psychiatric:        Mood and Affect: Mood normal.        Behavior: Behavior normal.       Assessment & Plan:  This visit occurred during the SARS-CoV-2 public health emergency.  Safety protocols were in place, including screening questions prior to the visit, additional usage of staff PPE, and extensive cleaning of exam room while observing appropriate contact time as indicated for disinfecting solutions.   Problem List Items Addressed This Visit    Unsteadiness on feet    Progressive, more shuffling gait and rigidity noted on exam today ?developing parkinsonism. Will ask HHPT to come out for evaluation, consider neurology input if worsening.  I did ask them to regularly use cane.       Relevant Orders   Ambulatory referral to Home Health   Systolic murmur    Chronic, stable.       Sundowning    Stable period. Trial off remeron 7.5mg  nightly to try and simplify polypharmacy.       Palliative care status    Appreciate palliative services care.       Irritability and anger    Stable period on sertraline and zyprexa.       Hypertension    BP markedly elevated today - did decrease 10 points at end of visit but remaining uncontrolled despite regularly taking amlodipine and toprol XL - advised start monitoring regularly at home, let me know readings when they call back next week for update off remeron - decide on medication changes at that time.        Dementia (HCC) - Primary    Memory deficit with  predominant behavioral disturbances.  Continues aricept, namenda.  With newly worsening unsteadiness, shuffling gait, rigidity, ?parkinsonism developing. Will refer for [facility] PT and discussed possible neurology evaluation pending PT results.      Relevant Orders   Comprehensive metabolic panel   CBC with Differential/Platelet   Ambulatory referral to Home Health  No orders of the defined types were placed in this encounter.  Orders Placed This Encounter  Procedures  . Comprehensive metabolic panel  . CBC with Differential/Platelet  . Ambulatory referral to Home Health    Referral Priority:   Routine    Referral Type:   Home Health Care    Referral Reason:   Specialty Services Required    Requested Specialty:   Home Health Services    Number of Visits Requested:   1    Patient Instructions  Labs today.  Trial of mirtazapine (remeron) - this medicine is used for appetite and sleep. Continue other medicines as up to now.  Blood pressure was elevated today - start monitoring daily at home and call me early next week with readings - then we will decide on blood pressure medicine changes if needed.  We will refer you for home health physical therapy.    Follow up plan: Return if symptoms worsen or fail to improve.  Eustaquio Boyden, MD

## 2019-11-27 NOTE — Assessment & Plan Note (Signed)
Appreciate palliative services care.

## 2019-11-27 NOTE — Patient Instructions (Addendum)
Labs today.  Trial of mirtazapine (remeron) - this medicine is used for appetite and sleep. Continue other medicines as up to now.  Blood pressure was elevated today - start monitoring daily at home and call me early next week with readings - then we will decide on blood pressure medicine changes if needed.  We will refer you for home health physical therapy.

## 2019-11-27 NOTE — Assessment & Plan Note (Addendum)
Memory deficit with predominant behavioral disturbances.  Continues aricept, namenda.  With newly worsening unsteadiness, shuffling gait, rigidity, ?parkinsonism developing. Will refer for [facility] PT and discussed possible neurology evaluation pending PT results.

## 2019-11-27 NOTE — Assessment & Plan Note (Signed)
Stable period on sertraline and zyprexa.

## 2019-11-27 NOTE — Assessment & Plan Note (Signed)
Chronic, stable 

## 2019-11-27 NOTE — Assessment & Plan Note (Addendum)
Stable period. Trial off remeron 7.5mg  nightly to try and simplify polypharmacy.

## 2019-11-27 NOTE — Assessment & Plan Note (Addendum)
Progressive, more shuffling gait and rigidity noted on exam today ?developing parkinsonism. Will ask HHPT to come out for evaluation, consider neurology input if worsening.  I did ask them to regularly use cane.

## 2019-11-27 NOTE — Assessment & Plan Note (Addendum)
BP markedly elevated today - did decrease 10 points at end of visit but remaining uncontrolled despite regularly taking amlodipine and toprol XL - advised start monitoring regularly at home, let me know readings when they call back next week for update off remeron - decide on medication changes at that time.

## 2019-12-02 ENCOUNTER — Telehealth: Payer: Self-pay | Admitting: *Deleted

## 2019-12-02 NOTE — Telephone Encounter (Signed)
Lvm asking Clayton Lefort to call back.  Need to inform him Dr. Reece Agar is giving verbal orders for services requested for pt.

## 2019-12-02 NOTE — Telephone Encounter (Signed)
Clayton Lefort PT with [facility] left a voicemail stating that patient has been referred to them for physical therapy. Clayton Lefort stated that he did an evaluation on patient Saturday. Clayton Lefort is requesting home health PT for once a week for one week, two times a week for 3 weeks and once a week for one week. Clayton Lefort stated that they will be working on home exercises, fall prevention, strengthening, balance and gait.

## 2019-12-02 NOTE — Telephone Encounter (Signed)
Agree with this. Thank you.  

## 2019-12-03 NOTE — Telephone Encounter (Signed)
Spoke with Erika Weiss notifying him Dr. Reece Agar is giving verbal orders for services requested for pt.

## 2019-12-04 ENCOUNTER — Telehealth: Payer: Self-pay | Admitting: Family Medicine

## 2019-12-04 ENCOUNTER — Other Ambulatory Visit: Payer: Self-pay | Admitting: Family Medicine

## 2019-12-04 NOTE — Telephone Encounter (Signed)
Dr. Reece Agar wanted pt's BP readings:   8/4 134/70  8/5 141/74  8/6 175/84  8/6 166/83  8/7 140/74  8/8 150/90  8/11 157/78

## 2019-12-05 NOTE — Telephone Encounter (Signed)
BP average is 152 systolic over the past week.  I would not make changes in HTN meds at this time - as dropping BP too much could actually worsen memory troubles. Would just continue to monitor. If trending up, would add low dose ACEI.

## 2019-12-05 NOTE — Telephone Encounter (Signed)
Spoke with pt's daughter, Tyler Aas (on dpr), relaying Dr. Timoteo Expose message.  She verbalizes understanding.

## 2019-12-17 DIAGNOSIS — F0391 Unspecified dementia with behavioral disturbance: Secondary | ICD-10-CM | POA: Diagnosis not present

## 2019-12-17 DIAGNOSIS — Z9181 History of falling: Secondary | ICD-10-CM

## 2019-12-17 DIAGNOSIS — F05 Delirium due to known physiological condition: Secondary | ICD-10-CM | POA: Diagnosis not present

## 2019-12-17 DIAGNOSIS — I10 Essential (primary) hypertension: Secondary | ICD-10-CM

## 2019-12-17 DIAGNOSIS — F329 Major depressive disorder, single episode, unspecified: Secondary | ICD-10-CM

## 2019-12-17 DIAGNOSIS — F419 Anxiety disorder, unspecified: Secondary | ICD-10-CM

## 2020-01-14 ENCOUNTER — Other Ambulatory Visit: Payer: Self-pay

## 2020-01-14 ENCOUNTER — Telehealth: Payer: Self-pay | Admitting: Nurse Practitioner

## 2020-01-14 ENCOUNTER — Other Ambulatory Visit: Payer: Medicare Other | Admitting: Nurse Practitioner

## 2020-01-14 NOTE — Telephone Encounter (Signed)
I called Erika Weiss to confirm appointment today; Erika asked the appointment to be rescheduled; rescheduled per wishes for PC f/u visit.  

## 2020-01-27 ENCOUNTER — Other Ambulatory Visit: Payer: Self-pay | Admitting: Family Medicine

## 2020-01-28 ENCOUNTER — Other Ambulatory Visit: Payer: Self-pay | Admitting: Family Medicine

## 2020-01-28 NOTE — Telephone Encounter (Signed)
Refill request Lorazepam Last refill 09/24/19 #40/1 Last office visit 11/27/19

## 2020-01-29 ENCOUNTER — Telehealth: Payer: Self-pay | Admitting: Family Medicine

## 2020-01-29 MED ORDER — LORAZEPAM 0.5 MG PO TABS: ORAL_TABLET | ORAL | 1 refills | Status: AC

## 2020-01-29 NOTE — Telephone Encounter (Signed)
Left message on vm per dpr notifying pt's daughter, [name] (on dpr), that refill was sent in.

## 2020-01-29 NOTE — Telephone Encounter (Signed)
ERx 

## 2020-01-29 NOTE — Telephone Encounter (Signed)
Pt's daughter called and says pt needs refill on Lorazepam 0.5 mg sent to [facility]. She has about 3-5 days left.  Thank you!

## 2020-01-30 ENCOUNTER — Telehealth: Payer: Self-pay | Admitting: Family Medicine

## 2020-01-30 ENCOUNTER — Other Ambulatory Visit: Payer: Self-pay | Admitting: Family Medicine

## 2020-01-30 NOTE — Telephone Encounter (Signed)
I already refilled this

## 2020-01-30 NOTE — Telephone Encounter (Signed)
E-scribed refill 

## 2020-01-30 NOTE — Telephone Encounter (Signed)
Patient's daughter called in requesting a new script for mirtazapine (REMERON) 15 MG tablet be sent to : [facility] - [place], [place] - [address] AT Portal to [employer] [employer] Phone:  [phone]  Fax:  [phone]

## 2020-02-05 ENCOUNTER — Telehealth: Payer: Self-pay | Admitting: Family Medicine

## 2020-02-05 NOTE — Telephone Encounter (Signed)
Received notice from daughter Tyler Aas about patient's passing on Monday afternoon (from what sounds like cardiac arrest).  Spoke with family as well as husband , expressed my condolences.

## 2020-02-24 DEATH — deceased

## 2020-03-10 ENCOUNTER — Other Ambulatory Visit: Payer: Medicare Other | Admitting: Nurse Practitioner

## 2020-12-13 IMAGING — DX DG HAND COMPLETE 3+V*L*
3 series · 3 of 3 positions shown · non-contrast
Comparison: 9332

CLINICAL DATA: Left dorsal hand injury

EXAM:
LEFT HAND - COMPLETE 3+ VIEW

[hand ap]
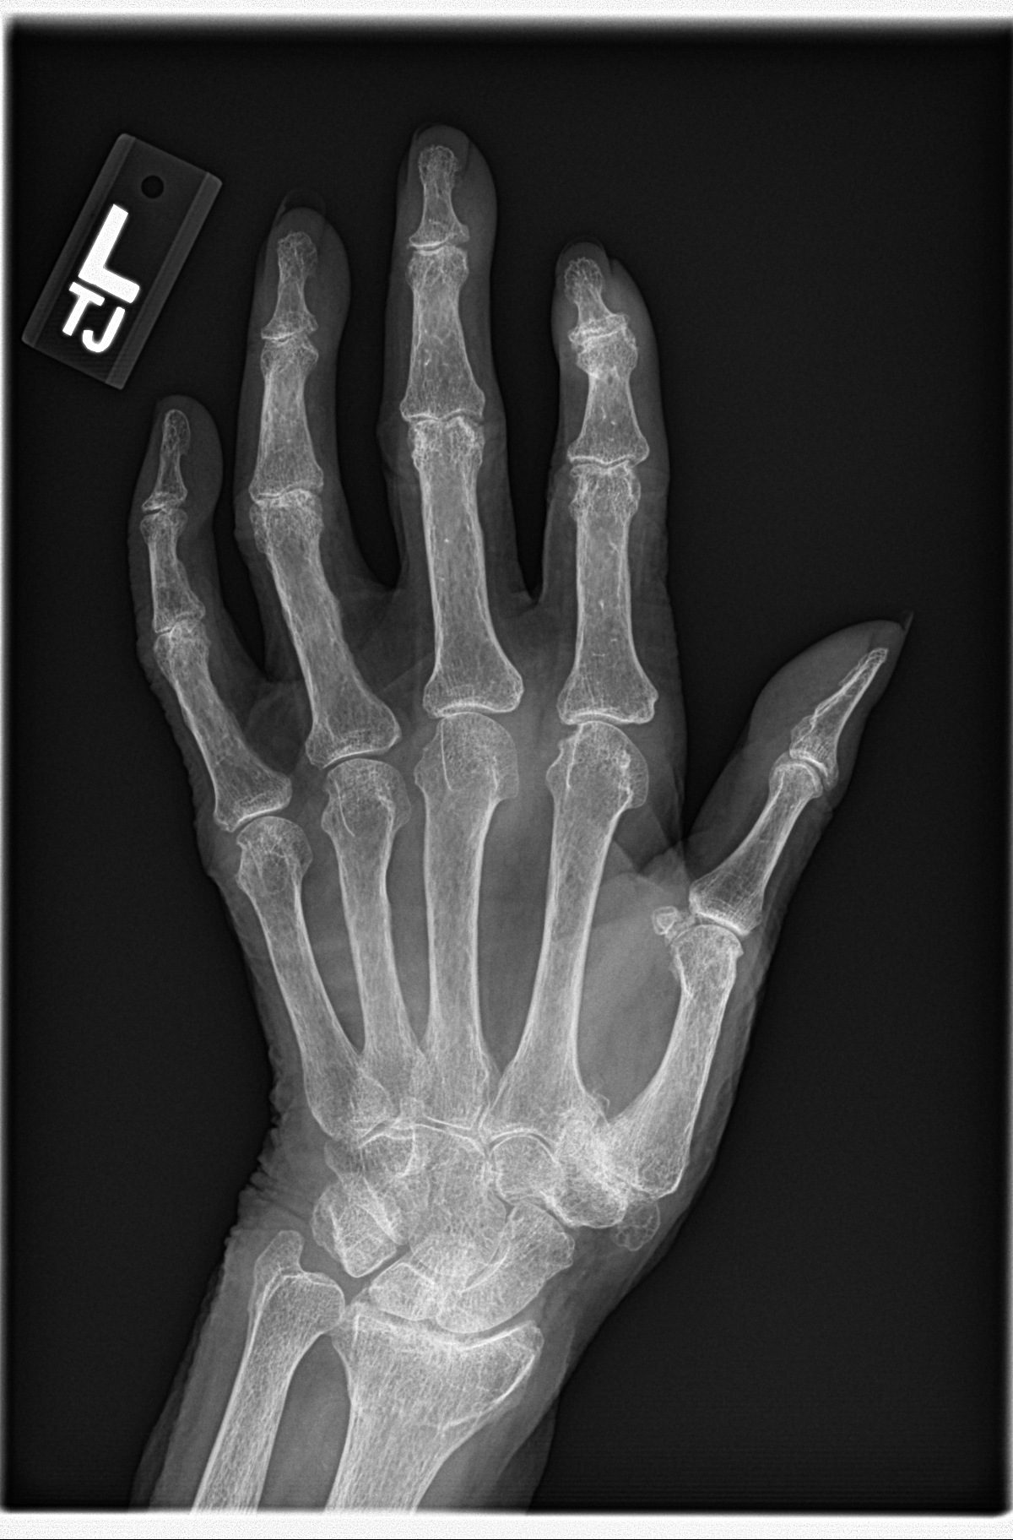

[hand obl]
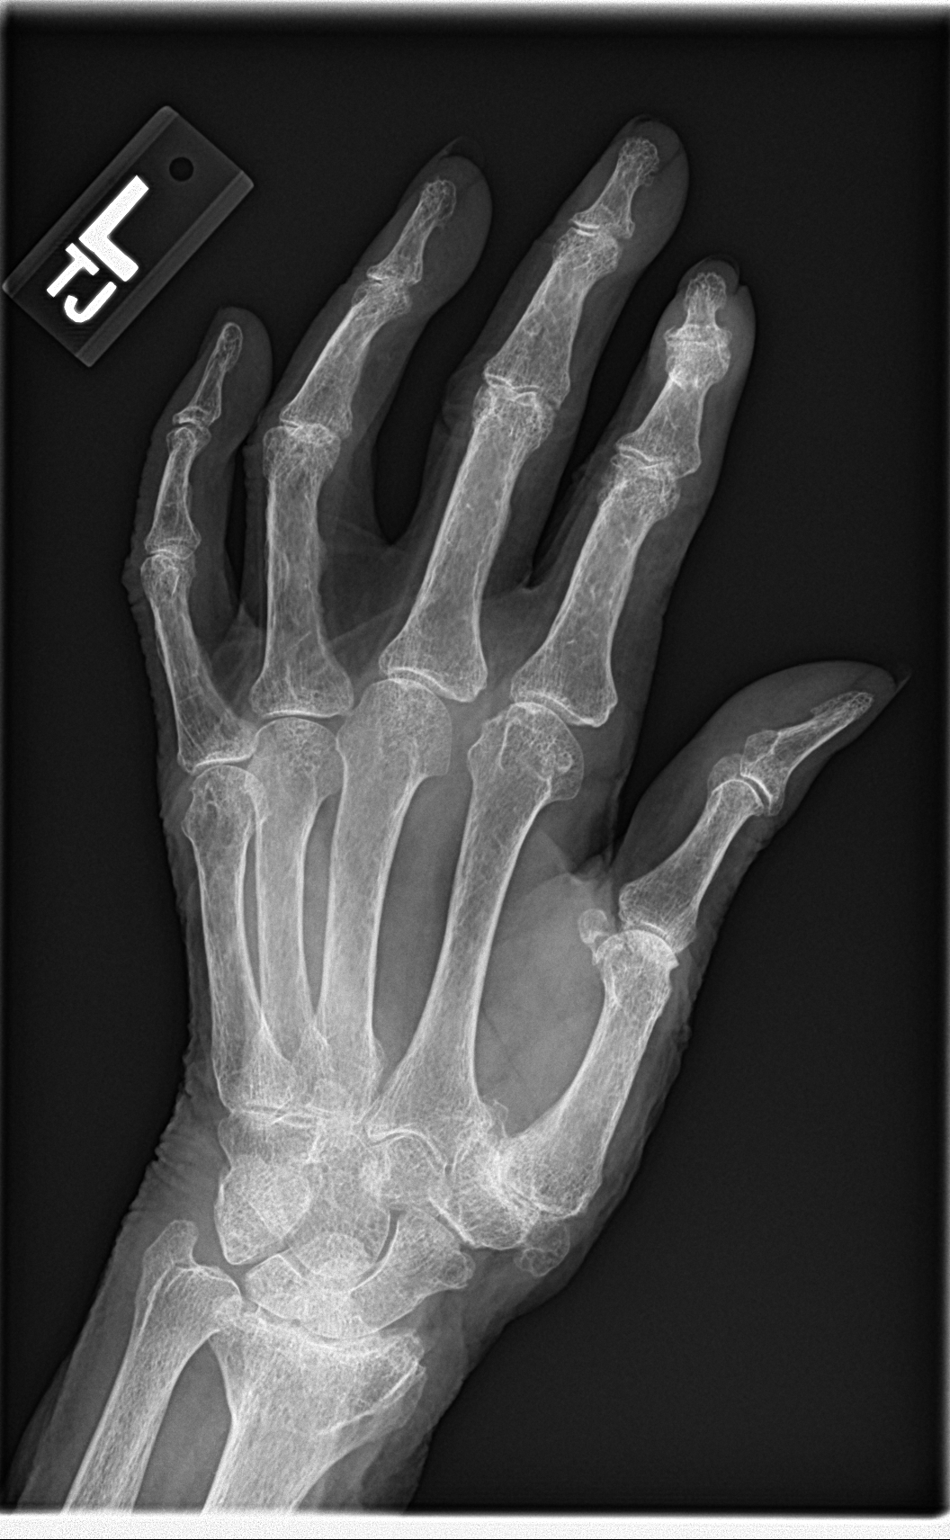

[hand lat]
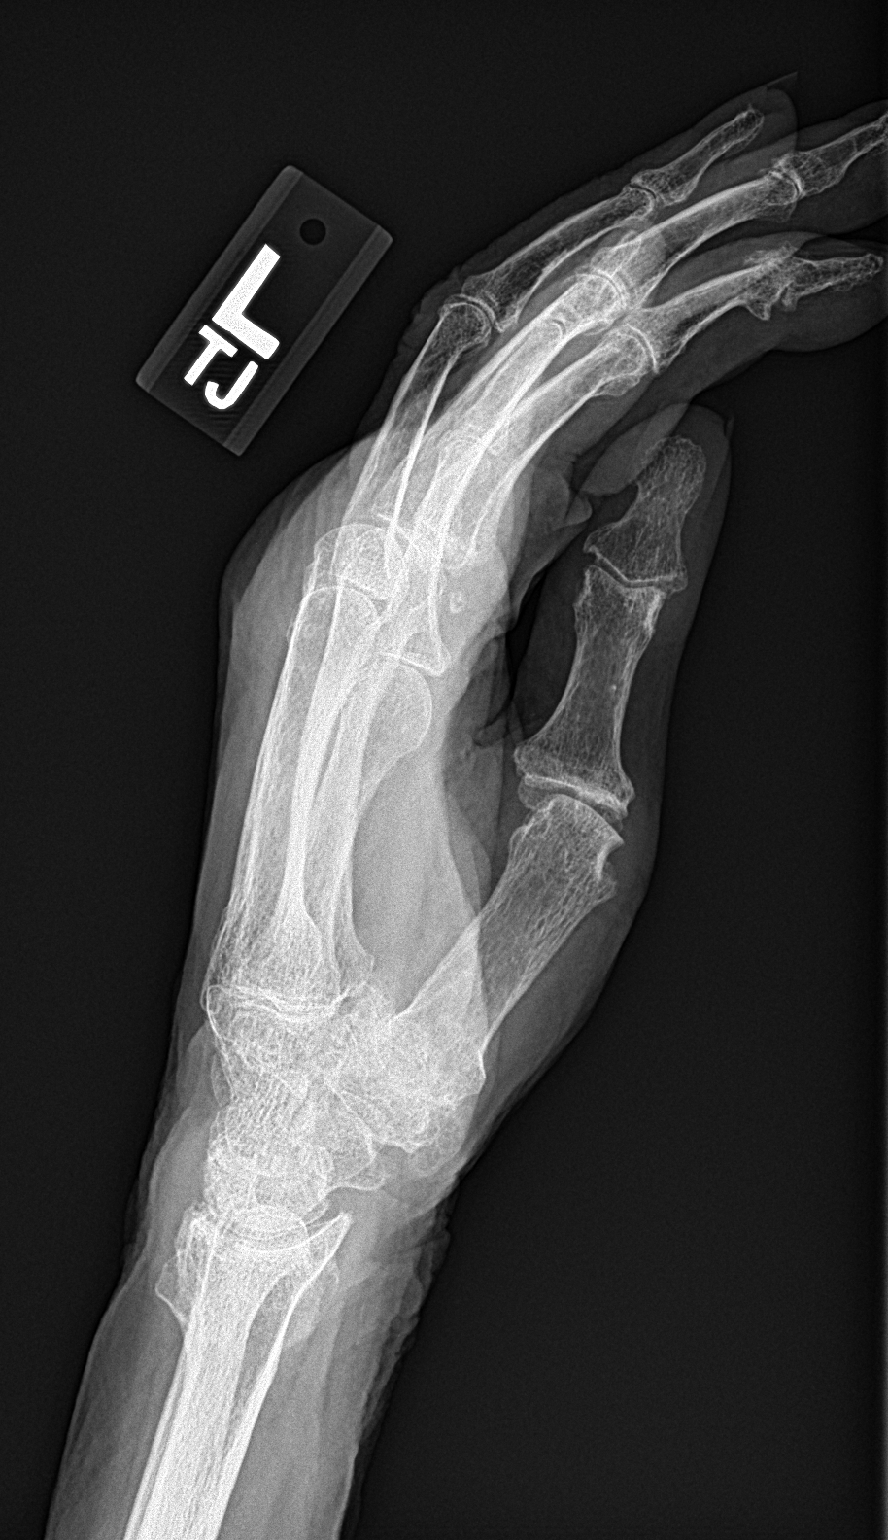

[3 of 3 positions shown; findings below may reference images not displayed]

FINDINGS: Osseous demineralization. Alignment is anatomic. No acute fracture.
Degenerative changes are present particularly at the
interphalangeal, first carpometacarpal, and radiocarpal joints.
Deformity of the distal radius likely related to prior fracture.
IMPRESSION: No acute fracture.
